# Patient Record
Sex: Female | Born: 1965 | Race: White | Hispanic: No | Marital: Married | Smoking: Never smoker
Health system: Southern US, Community
[De-identification: ages and names within clinical notes are randomized; demographics above are authoritative.]

## PROBLEM LIST (undated history)

## (undated) DIAGNOSIS — G43009 Migraine without aura, not intractable, without status migrainosus: Secondary | ICD-10-CM

## (undated) DIAGNOSIS — I493 Ventricular premature depolarization: Secondary | ICD-10-CM

## (undated) DIAGNOSIS — C801 Malignant (primary) neoplasm, unspecified: Secondary | ICD-10-CM

## (undated) DIAGNOSIS — R42 Dizziness and giddiness: Secondary | ICD-10-CM

## (undated) DIAGNOSIS — E039 Hypothyroidism, unspecified: Secondary | ICD-10-CM

## (undated) HISTORY — DX: Migraine without aura, not intractable, without status migrainosus: G43.009

## (undated) HISTORY — DX: Hypothyroidism, unspecified: E03.9

## (undated) HISTORY — DX: Ventricular premature depolarization: I49.3

## (undated) HISTORY — DX: Dizziness and giddiness: R42

## (undated) HISTORY — DX: Malignant (primary) neoplasm, unspecified: C80.1

## (undated) HISTORY — PX: SALPINGOOPHORECTOMY: SHX82

## (undated) HISTORY — PX: VAGINAL HYSTERECTOMY: SUR661

---

## 2000-07-23 ENCOUNTER — Emergency Department (HOSPITAL_COMMUNITY): Admission: EM | Admit: 2000-07-23 | Discharge: 2000-07-23 | Payer: Self-pay | Admitting: Emergency Medicine

## 2000-07-23 ENCOUNTER — Encounter: Payer: Self-pay | Admitting: Emergency Medicine

## 2005-12-18 ENCOUNTER — Ambulatory Visit (HOSPITAL_COMMUNITY): Admission: RE | Admit: 2005-12-18 | Discharge: 2005-12-18 | Payer: Self-pay | Admitting: Family Medicine

## 2010-03-28 ENCOUNTER — Encounter
Admission: RE | Admit: 2010-03-28 | Discharge: 2010-03-28 | Payer: Self-pay | Source: Home / Self Care | Attending: Podiatrist | Admitting: Podiatrist

## 2010-09-09 DIAGNOSIS — K219 Gastro-esophageal reflux disease without esophagitis: Secondary | ICD-10-CM | POA: Insufficient documentation

## 2010-10-13 DIAGNOSIS — E669 Obesity, unspecified: Secondary | ICD-10-CM | POA: Insufficient documentation

## 2010-10-13 DIAGNOSIS — J309 Allergic rhinitis, unspecified: Secondary | ICD-10-CM | POA: Insufficient documentation

## 2011-05-18 ENCOUNTER — Other Ambulatory Visit (HOSPITAL_COMMUNITY): Payer: Self-pay | Admitting: Family Medicine

## 2011-05-18 DIAGNOSIS — S92209A Fracture of unspecified tarsal bone(s) of unspecified foot, initial encounter for closed fracture: Secondary | ICD-10-CM

## 2011-05-18 DIAGNOSIS — S92309A Fracture of unspecified metatarsal bone(s), unspecified foot, initial encounter for closed fracture: Secondary | ICD-10-CM

## 2011-05-19 ENCOUNTER — Ambulatory Visit (HOSPITAL_COMMUNITY)
Admission: RE | Admit: 2011-05-19 | Discharge: 2011-05-19 | Disposition: A | Discharge status: Home / Self Care | Payer: 59 | Source: Ambulatory Visit | Attending: Family Medicine | Admitting: Family Medicine

## 2011-05-19 DIAGNOSIS — X58XXXA Exposure to other specified factors, initial encounter: Secondary | ICD-10-CM | POA: Insufficient documentation

## 2011-05-19 DIAGNOSIS — S92309A Fracture of unspecified metatarsal bone(s), unspecified foot, initial encounter for closed fracture: Secondary | ICD-10-CM | POA: Insufficient documentation

## 2011-05-19 DIAGNOSIS — S92209A Fracture of unspecified tarsal bone(s) of unspecified foot, initial encounter for closed fracture: Secondary | ICD-10-CM

## 2011-05-19 DIAGNOSIS — Z780 Asymptomatic menopausal state: Secondary | ICD-10-CM | POA: Insufficient documentation

## 2011-06-29 ENCOUNTER — Other Ambulatory Visit: Payer: Self-pay | Admitting: Podiatrist

## 2011-06-29 DIAGNOSIS — M79673 Pain in unspecified foot: Secondary | ICD-10-CM

## 2011-06-30 ENCOUNTER — Ambulatory Visit
Admission: RE | Admit: 2011-06-30 | Discharge: 2011-06-30 | Disposition: A | Discharge status: Home / Self Care | Payer: 59 | Source: Ambulatory Visit | Attending: Podiatrist | Admitting: Podiatrist

## 2011-06-30 DIAGNOSIS — M79673 Pain in unspecified foot: Secondary | ICD-10-CM

## 2011-07-13 ENCOUNTER — Encounter: Payer: Self-pay | Admitting: *Deleted

## 2011-07-15 ENCOUNTER — Encounter: Payer: Self-pay | Admitting: *Deleted

## 2011-07-17 ENCOUNTER — Encounter: Payer: Self-pay | Admitting: Cardiology

## 2011-07-17 ENCOUNTER — Ambulatory Visit (INDEPENDENT_AMBULATORY_CARE_PROVIDER_SITE_OTHER): Payer: 59 | Admitting: Cardiology

## 2011-07-17 DIAGNOSIS — I493 Ventricular premature depolarization: Secondary | ICD-10-CM | POA: Insufficient documentation

## 2011-07-17 DIAGNOSIS — E039 Hypothyroidism, unspecified: Secondary | ICD-10-CM | POA: Insufficient documentation

## 2011-07-17 DIAGNOSIS — I4949 Other premature depolarization: Secondary | ICD-10-CM

## 2011-07-17 DIAGNOSIS — R0989 Other specified symptoms and signs involving the circulatory and respiratory systems: Secondary | ICD-10-CM

## 2011-07-17 NOTE — Assessment & Plan Note (Signed)
On Synthroid, with normal TSH in February.

## 2011-07-17 NOTE — Progress Notes (Signed)
Clinical Summary [name] is a 46 y.o.female referred for cardiology consultation by Terie Purser in [place]. She was previously followed by [facility] with a history of symptomatic PVCs and palpitations. This was originally diagnosed back in 2009 by cardiac monitor, at which point she had an echocardiogram demonstrating no major structural abnormalities with normal LVEF. She is not on any specific medical therapy for this, and states that her symptoms ultimately got better.   Within the last 3 months she has had more palpitations, mainly in February, however now indicates that her symptoms are better. She has not had any syncope or chest pain associated with these symptoms. She feels them during the day, also at nighttime. She reports regular caffeine intake of at least 2 drinks a day, no regular exercise since injuring her left ankle earlier in the year.  I reviewed her outside records from [facility] today. She was actually just recently seen in February, at which time observation was recommended. She is concurrently being treated for hypothyroidism with Synthroid, and had normal TSH of 2.9 from February.   Allergies  Allergen Reactions  . Penicillins Rash    Current Outpatient Prescriptions  Medication Sig Dispense Refill  . Cholecalciferol (VITAMIN D3) 1000 UNITS CAPS Take 1 capsule by mouth daily.      . fluticasone (FLONASE) 50 MCG/ACT nasal spray Place 2 sprays into both nostrils Daily.      Marland Kitchen HYDROcodone-acetaminophen (NORCO) 5-325 MG per tablet Take 1 tablet by mouth as needed.      Marland Kitchen levothyroxine (SYNTHROID, LEVOTHROID) 50 MCG tablet Take 50 mcg by mouth daily.      . Multiple Vitamin (MULTIVITAMIN) tablet Take 1 tablet by mouth daily.      Marland Kitchen VIVELLE-DOT 0.075 MG/24HR Apply 1 patch topically 2 (two) times a week.        Past Medical History  Diagnosis Date  . PVC's (premature ventricular contractions)   . Hypothyroidism     Past Surgical History    Procedure Date  . Vaginal hysterectomy   . Salpingoophorectomy     LEFT    Family History  Problem Relation Age of Onset  . Hypothyroidism Mother   . Hypothyroidism Sister   . Arrhythmia Mother     PVC's    Social History Ms. Sisk reports that she has been smoking Cigarettes.  She has never used smokeless tobacco. [name] reports that she drinks alcohol.  Review of Systems No orthopnea or PND. Stable appetite. No reported bleeding problems. Otherwise negative except as outlined above.  Physical Examination Filed Vitals:   07/17/11 1318  BP: 117/61  Pulse: 69   Overweight woman in no acute distress. HEENT: Conjunctiva and lids normal, oropharynx clear. Neck: Supple, no elevated JVP or carotid bruits, no thyromegaly. Lungs: Clear to auscultation, nonlabored breathing at rest. Cardiac: Regular rate and rhythm, no S3 or significant systolic murmur, no pericardial rub. Abdomen: Soft, nontender, bowel sounds present, no guarding or rebound. Extremities: No pitting edema, distal pulses 2+. Skin: Warm and dry. Musculoskeletal: No kyphosis. Neuropsychiatric: Alert and oriented x3, affect grossly appropriate.   ECG Reviewed in EMR.   Problem List and Plan   PVC's (premature ventricular contractions) At this point would recommend observation based on improvement in her symptoms and previous workup. Records were reviewed today. ECG is nonspecific. She reports no definite exertional chest pain or limiting shortness of breath, no syncope with her palpitations. I did discuss limiting caffeine, preferably discontinue its use if she  is able, also try to initiate a regular exercise regimen again. Followup will be arranged in 6 months. If her symptoms worsen, will probably start with placing a followup cardiac monitor  Hypothyroidism On Synthroid, with normal TSH in February.     Jonelle Sidle, M.D., F.A.C.C.

## 2011-07-17 NOTE — Assessment & Plan Note (Signed)
At this point would recommend observation based on improvement in her symptoms and previous workup. Records were reviewed today. ECG is nonspecific. She reports no definite exertional chest pain or limiting shortness of breath, no syncope with her palpitations. I did discuss limiting caffeine, preferably discontinue its use if she is able, also try to initiate a regular exercise regimen again. Followup will be arranged in 6 months. If her symptoms worsen, will probably start with placing a followup cardiac monitor

## 2011-07-17 NOTE — Patient Instructions (Signed)
Your physician wants you to follow-up in: 6 months. You will receive a reminder letter in the mail one-two months in advance. If you don't receive a letter, please call our office to schedule the follow-up appointment. Your physician recommends that you continue on your current medications as directed. Please refer to the Current Medication list given to you today. 

## 2013-04-14 ENCOUNTER — Ambulatory Visit (INDEPENDENT_AMBULATORY_CARE_PROVIDER_SITE_OTHER): Payer: 59 | Admitting: Podiatrist

## 2013-04-14 ENCOUNTER — Ambulatory Visit (INDEPENDENT_AMBULATORY_CARE_PROVIDER_SITE_OTHER): Payer: 59

## 2013-04-14 ENCOUNTER — Encounter: Payer: Self-pay | Admitting: Podiatrist

## 2013-04-14 DIAGNOSIS — M79609 Pain in unspecified limb: Secondary | ICD-10-CM

## 2013-04-14 DIAGNOSIS — M7730 Calcaneal spur, unspecified foot: Secondary | ICD-10-CM

## 2013-04-14 DIAGNOSIS — M722 Plantar fascial fibromatosis: Secondary | ICD-10-CM

## 2013-04-14 MED ORDER — PREDNISONE 10 MG PO KIT: PACK | ORAL | Status: DC

## 2013-04-14 MED ORDER — MELOXICAM 15 MG PO TABS: 15.0000 mg | ORAL_TABLET | Freq: Every day | ORAL | Status: DC

## 2013-04-14 NOTE — Patient Instructions (Addendum)
If the heel pain does not improve within 2 days of the injection- go ahead and take the prednisone taper pack-- then start the meloxicam after that-- do not take the 2 medications together.    Plantar Fasciitis (Heel Spur Syndrome) with Rehab The plantar fascia is a fibrous, ligament-like, soft-tissue structure that spans the bottom of the foot. Plantar fasciitis is a condition that causes pain in the foot due to inflammation of the tissue. SYMPTOMS   Pain and tenderness on the underneath side of the foot.  Pain that worsens with standing or walking. CAUSES  Plantar fasciitis is caused by irritation and injury to the plantar fascia on the underneath side of the foot. Common mechanisms of injury include:  Direct trauma to bottom of the foot.  Damage to a small nerve that runs under the foot where the main fascia attaches to the heel bone. Stress placed on the plantar fascia due to any mild increased activity or injury RISK INCREASES WITH:   Obesity.  Poor strength and flexibility.  Improperly fitted shoes.  Tight calf muscles.  Flat feet.  Failure to warm-up properly before activity.  PREVENTION  Warm up and stretch properly before activity.  Strength, flexibility  Maintain a health body weight.  Avoid stress on the plantar fascia.  Wear properly fitted shoes, including arch supports for individuals who have flat feet. PROGNOSIS  If treated properly, then the symptoms of plantar fasciitis usually resolve without surgery. However, occasionally surgery is necessary. RELATED COMPLICATIONS   Recurrent symptoms that may result in a chronic condition.  Problems of the lower back that are caused by compensating for the injury, such as limping.  Pain or weakness of the foot during push-off following surgery.  Chronic inflammation, scarring, and partial or complete fascia tear, occurring more often from repeated injections. TREATMENT  Treatment initially involves the use  of ice and medication to help reduce pain and inflammation. The use of strengthening and stretching exercises may help reduce pain with activity, especially stretches of the Achilles tendon.  Your caregiver may recommend that you use arch supports to help reduce stress on the plantar fascia. Often, corticosteroid injections are given to reduce inflammation. If symptoms persist for greater than 6 months despite non-surgical (conservative), then surgery may be recommended.  MEDICATION   If pain medication is necessary, then nonsteroidal anti-inflammatory medications, such as aspirin and ibuprofen, or other minor pain relievers, such as acetaminophen, are often recommended. Corticosteroid injections may be given by your caregiver.  HEAT AND COLD  Cold treatment (icing) relieves pain and reduces inflammation. Cold treatment should be applied for 10 to 15 minutes every 2 to 3 hours for inflammation and pain and immediately after any activity that aggravates your symptoms. Use ice packs or massage the area with a piece of ice (ice massage).  Heat treatment may be used prior to performing the stretching and strengthening activities prescribed by your caregiver, physical therapist, or athletic trainer. Use a heat pack or soak the injury in warm water. SEEK IMMEDIATE MEDICAL CARE IF:  Treatment seems to offer no benefit, or the condition worsens.  Any medications produce adverse side effects.    EXERCISES-- perform each exercise a total of 10-15 repetitions.  Hold for 30 seconds and perform 3 times per day   RANGE OF MOTION (ROM) AND STRETCHING EXERCISES - Plantar Fasciitis (Heel Spur Syndrome) These exercises may help you when beginning to rehabilitate your injury.   While completing these exercises, remember:   Restoring tissue  flexibility helps normal motion to return to the joints. This allows healthier, less painful movement and activity.  An effective stretch should be held for at least 30  seconds.  A stretch should never be painful. You should only feel a gentle lengthening or release in the stretched tissue. RANGE OF MOTION - Toe Extension, Flexion  Sit with your right / left leg crossed over your opposite knee.  Grasp your toes and gently pull them back toward the top of your foot. You should feel a stretch on the bottom of your toes and/or foot.  Hold this stretch for __________ seconds.  Now, gently pull your toes toward the bottom of your foot. You should feel a stretch on the top of your toes and or foot.  Hold this stretch for __________ seconds. Repeat __________ times. Complete this stretch __________ times per day.  RANGE OF MOTION - Ankle Dorsiflexion, Active Assisted  Remove shoes and sit on a chair that is preferably not on a carpeted surface.  Place right / left foot under knee. Extend your opposite leg for support.  Keeping your heel down, slide your right / left foot back toward the chair until you feel a stretch at your ankle or calf. If you do not feel a stretch, slide your bottom forward to the edge of the chair, while still keeping your heel down.  Hold this stretch for __________ seconds. Repeat __________ times. Complete this stretch __________ times per day.  STRETCH  Gastroc, Standing  Place hands on wall.  Extend right / left leg, keeping the front knee somewhat bent.  Slightly point your toes inward on your back foot.  Keeping your right / left heel on the floor and your knee straight, shift your weight toward the wall, not allowing your back to arch.  You should feel a gentle stretch in the right / left calf. Hold this position for __________ seconds. Repeat __________ times. Complete this stretch __________ times per day. STRETCH  Soleus, Standing  Place hands on wall.  Extend right / left leg, keeping the other knee somewhat bent.  Slightly point your toes inward on your back foot.  Keep your right / left heel on the floor,  bend your back knee, and slightly shift your weight over the back leg so that you feel a gentle stretch deep in your back calf.  Hold this position for __________ seconds. Repeat __________ times. Complete this stretch __________ times per day. STRETCH  Gastrocsoleus, Standing  Note: This exercise can place a lot of stress on your foot and ankle. Please complete this exercise only if specifically instructed by your caregiver.   Place the ball of your right / left foot on a step, keeping your other foot firmly on the same step.  Hold on to the wall or a rail for balance.  Slowly lift your other foot, allowing your body weight to press your heel down over the edge of the step.  You should feel a stretch in your right / left calf.  Hold this position for __________ seconds.  Repeat this exercise with a slight bend in your right / left knee. Repeat __________ times. Complete this stretch __________ times per day.  STRENGTHENING EXERCISES - Plantar Fasciitis (Heel Spur Syndrome)  These exercises may help you when beginning to rehabilitate your injury. They may resolve your symptoms with or without further involvement from your physician, physical therapist or athletic trainer. While completing these exercises, remember:   Muscles can gain  both the endurance and the strength needed for everyday activities through controlled exercises.  Complete these exercises as instructed by your physician, physical therapist or athletic trainer. Progress the resistance and repetitions only as guided.

## 2013-04-14 NOTE — Progress Notes (Signed)
   Subjective:    Patient ID: Kristine Melton, female    DOB: 03/18/1965, 48 y.o.   MRN: [id number]  HPI patient presents today for right heel pain. She states "my right heel is hurting and has been going on for about 3 weeks and pain in am and have been sleeping in the night splint and hurts to walk". She's been treated for plantar fasciitis in the past and she feels it may have flared up again.    Review of Systems  Hematological: Bruises/bleeds easily.  All other systems reviewed and are negative.       Objective:   Physical Exam  GENERAL APPEARANCE: Alert, conversant. Appropriately groomed. No acute distress.  VASCULAR: Pedal pulses palpable at 2/4 DP and PT bilateral.  Capillary refill time is immediate to all digits,  Proximal to distal cooling it warm to warm.  Digital hair growth is present bilateral  NEUROLOGIC: sensation is intact epicritically and protectively to 5.07 monofilament at 5/5 sites bilateral.  Light touch is intact bilateral, vibratory sensation intact bilateral, achilles tendon reflex is intact bilateral.  MUSCULOSKELETAL: Pain on palpation plantar medial aspect of the right heel at the insertion of the plantar fascia on the medial calcaneal tubercle is noted. Swelling in this area is also palpated. DERMATOLOGIC: skin color, texture, and turger are within normal limits.  No preulcerative lesions are seen, no interdigital maceration noted.  No open lesions present.  Digital nails are asymptomatic.      Assessment & Plan:  Plantar fasciitis right  Plan:  Discussed etiology, pathology, conservative vs. Surgical therapies and at this time a plantar fascial injection was recommended.  The patient agreed and a sterile skin prep was applied.  An injection consisting of kenalog and marcaine mixture was infiltrated at the point of maximal tenderness on the right Heel.  The patient tolerated this well and was given instructions for aftercare. rx for a predinisone taper and  meloxicam was e prescribed.  I will see her back for recheck in 3-4 weeks.

## 2013-05-29 ENCOUNTER — Telehealth: Payer: Self-pay | Admitting: *Deleted

## 2013-05-29 NOTE — Telephone Encounter (Signed)
I need a refill for my Voltaren Gel.  Call into [facility] [place], [place].

## 2013-05-30 MED ORDER — DICLOFENAC SODIUM 1 % TD GEL: 2.0000 g | Freq: Four times a day (QID) | TRANSDERMAL | Status: DC

## 2013-05-30 NOTE — Telephone Encounter (Signed)
I called and left the patient a message that her prescription was sent to the pharmacy.

## 2013-05-30 NOTE — Telephone Encounter (Signed)
rx for voltaren gel called into [facility] [place]- 4 refills

## 2014-05-08 ENCOUNTER — Encounter: Payer: Self-pay | Admitting: Neurology

## 2014-05-09 ENCOUNTER — Encounter: Payer: Self-pay | Admitting: Neurology

## 2014-05-09 ENCOUNTER — Ambulatory Visit (INDEPENDENT_AMBULATORY_CARE_PROVIDER_SITE_OTHER): Payer: 59 | Admitting: Neurology

## 2014-05-09 VITALS — BP 131/86 | HR 66 | Temp 97.8°F | Resp 16 | Ht 67.0 in | Wt 233.0 lb

## 2014-05-09 DIAGNOSIS — H8120 Vestibular neuronitis, unspecified ear: Secondary | ICD-10-CM | POA: Diagnosis not present

## 2014-05-09 DIAGNOSIS — R42 Dizziness and giddiness: Secondary | ICD-10-CM

## 2014-05-09 DIAGNOSIS — H8190 Unspecified disorder of vestibular function, unspecified ear: Secondary | ICD-10-CM

## 2014-05-09 DIAGNOSIS — H9313 Tinnitus, bilateral: Secondary | ICD-10-CM

## 2014-05-09 NOTE — Progress Notes (Signed)
Subjective:    Patient ID: Kristine Melton is a 49 y.o. female.  HPI     Star Age, MD, PhD [facility] [address] [place], [place] [address]  Dear Dr. Redmond Pulling,  I saw your patient, Kristine Melton, upon your kind request in my neurologic clinic today for initial consultation of her intermittent vertiginous symptoms and dizziness. The patient is unaccompanied today. As you know, [name] is a 49 year old right-handed woman with an underlying medical history of obesity, hypothyroidism, anxiety, and allergies, who reports recurrent episodes of vertigo type symptoms which can last up to hours. She had a mildly abnormal electro nystagmogram but she was not felt to have peripheral vestibular disorder. She was given a prescription for Imitrex. Symptoms started approximately 3 months ago. Triggers may include change in position. Her electronystagmogram report from 04/12/2014 was reviewed and showed right beating nystagmus with eyes closed and the gaze rightward position. Dix-Hallpike testing was negative for BPPV. Positional testing revealed a direction fixed left beating positional nystagmus with eyes closed in 4 positions suggesting a peripheral pathology. Caloric testing revealed directional preponderance of 21% on the right which suggest mild peripheral pathology. She has no visual aura, no nausea, vomiting, except one time in December 2015. She reports sinus issues, congestion and difficulty breathing through her nose at night. She has no personal history of migrainous HAs, but mother and sister have migraines, MGM has migraines, and mother also vertigo.  She reports no throbbing headaches, no hearing loss, no one-sided weakness, numbness, tingling, droopy face or slurring of speech, and is currently experiencing mild lightheadedness at times especially with change in position. She has not tried Imitrex. Since her big bout of vertigo in December she has  had smaller episodes. Symptoms may last for minutes only. She has ringing in her ears bilaterally which can last for days.  Her Past Medical History Is Significant For: Past Medical History  Diagnosis Date  . PVC's (premature ventricular contractions)   . Hypothyroidism   . Atypical migraine   . Vertigo   . Cancer     Skin    Her Past Surgical History Is Significant For: Past Surgical History  Procedure Laterality Date  . Vaginal hysterectomy    . Salpingoophorectomy      LEFT    Her Family History Is Significant For: Family History  Problem Relation Age of Onset  . Hypothyroidism Mother   . Arrhythmia Mother     PVC's  . Fibromyalgia Mother   . Macular degeneration Mother   . Migraines Mother   . Hypothyroidism Sister     Her Social History Is Significant For: History   Social History  . Marital Status: Married    Spouse Name: Charles Niese  . Number of Children: 2  . Years of Education: Associates   Occupational History  . 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 Smoking status: Never Smoker   . Smokeless tobacco: Never Used  . Alcohol Use: 0.0 oz/week    0 Standard drinks or equivalent per week     Comment: Occasional  . Drug Use: No  . Sexual Activity: Not on file   Other Topics Concern  . None   Social History Narrative   Has 1 or 2 drinks with caffeine a day .     Her Allergies Are:  Allergies  Allergen Reactions  . Amoxicillin   .  Penicillins Rash  :   Her Current Medications Are:  Outpatient Encounter Prescriptions as of 05/09/2014  Medication Sig  . ALPRAZolam (XANAX) 0.5 MG tablet Take 0.5 mg by mouth 2 (two) times daily.  . Cholecalciferol (VITAMIN D-3 PO) Take by mouth.  . diclofenac sodium (VOLTAREN) 1 % GEL Apply topically 4 (four) times daily.  Marland Kitchen estradiol (VIVELLE-DOT) 0.075 MG/24HR Place 1 patch onto the skin.  Marland Kitchen estradiol (VIVELLE-DOT) 0.1 MG/24HR patch Place 1 patch onto the  skin 2 (two) times a week.  . fluticasone (FLONASE) 50 MCG/ACT nasal spray Place 2 sprays into both nostrils Daily.  Marland Kitchen HYDROcodone-acetaminophen (NORCO/VICODIN) 5-325 MG per tablet Take 1 tablet by mouth every 6 (six) hours as needed for moderate pain.  Marland Kitchen levothyroxine (SYNTHROID, LEVOTHROID) 50 MCG tablet Take 50 mcg by mouth daily.  Marland Kitchen levothyroxine (SYNTHROID, LEVOTHROID) 50 MCG tablet TAKE 1 TABLET (50 MCG TOTAL) BY MOUTH DAILY.  . meloxicam (MOBIC) 15 MG tablet Take 15 mg by mouth daily.  . [DISCONTINUED] ALPRAZolam (XANAX) 0.5 MG tablet Take 0.5 mg by mouth 3 (three) times daily as needed for anxiety.  :  Review of Systems:  Out of a complete 14 point review of systems, all are reviewed and negative with the exception of these symptoms as listed below:    Review of Systems  HENT: Positive for tinnitus.        Spinning sensation    Objective:  Neurologic Exam  Physical Exam Physical Examination:   Filed Vitals:   05/09/14 0902  BP: 131/86  Pulse: 66  Temp:   Resp:     General Examination: The patient is a very pleasant 49 y.o. female in no acute distress. She appears well-developed and well-nourished and well groomed.   HEENT: Normocephalic, atraumatic, pupils are equal, round and reactive to light and accommodation. Funduscopic exam is normal with sharp disc margins noted. Extraocular tracking is good without limitation to gaze excursion or nystagmus noted. Normal smooth pursuit is noted. Hearing is grossly intact. Tympanic membranes are clear bilaterally. Face is symmetric with normal facial animation and normal facial sensation. Speech is clear with no dysarthria noted. There is no hypophonia. There is no lip, neck/head, jaw or voice tremor. Neck is supple with full range of passive and active motion. There are no carotid bruits on auscultation. Oropharynx exam reveals: mild mouth dryness, good dental hygiene and mild airway crowding, due to narrow airway entry. Mallampati is  class II. Tongue protrudes centrally and palate elevates symmetrically. Tonsils are absent.  She has no vertiginous symptoms upon change in head position.  Chest: Clear to auscultation without wheezing, rhonchi or crackles noted.  Heart: S1+S2+0, regular and normal without murmurs, rubs or gallops noted.   Abdomen: Soft, non-tender and non-distended with normal bowel sounds appreciated on auscultation.  Extremities: There is no pitting edema in the distal lower extremities bilaterally. Pedal pulses are intact.  Skin: Warm and dry without trophic changes noted. There are no varicose veins.  Musculoskeletal: exam reveals no obvious joint deformities, tenderness or joint swelling or erythema.   Neurologically:  Mental status: The patient is awake, alert and oriented in all 4 spheres. Her immediate and remote memory, attention, language skills and fund of knowledge are appropriate. There is no evidence of aphasia, agnosia, apraxia or anomia. Speech is clear with normal prosody and enunciation. Thought process is linear. Mood is normal and affect is normal.  Cranial nerves II - XII are as described above under HEENT exam. In addition:  shoulder shrug is normal with equal shoulder height noted. Motor exam: Normal bulk, strength and tone is noted. There is no drift, tremor or rebound. Romberg is negative except for minimal sway. Reflexes are 2+ throughout. Babinski: Toes are flexor bilaterally. Fine motor skills and coordination: intact with normal finger taps, normal hand movements, normal rapid alternating patting, normal foot taps and normal foot agility.  Cerebellar testing: No dysmetria or intention tremor on finger to nose testing. Heel to shin is unremarkable bilaterally. There is no truncal or gait ataxia.  Sensory exam: intact to light touch, pinprick, vibration, temperature sense in the upper and lower extremities.  Gait, station and balance: She stands easily. No veering to one side is noted.  No leaning to one side is noted. Posture is age-appropriate and stance is narrow based. Gait shows normal stride length and normal pace. No problems turning are noted. She turns en bloc. Tandem walk is slightly difficult for her. Intact toe and heel stance is noted.               Assessment and Plan:   In summary, ZOSIA LUCCHESE is a very pleasant 49 y.o.-year old female with an underlying medical history of obesity, hypothyroidism, anxiety, and allergies, who reports recurrent episodes of vertigo type symptoms which can last up to hours. Her physical exam is benign neurological exam is nonfocal. Her balance is slightly off.  with a  her history is not telltale for vertiginous migraines. In fact she has no clear migraine type features. She does have a family history of migraines. She has sinus headaches. She takes ibuprofen as needed for this. She also uses a saline nasal rinse system. At this juncture, I suggested we proceed with a brain MRI with and without contrast and if this is normal she can follow-up as needed with me. She is advised to discuss with you the possibility of doing vestibular rehabilitation. She describes bilateral tinnitus and her ENG was mildly abnormal. I reassured her that her neurological exam is benign at this time. I did not suggest any new medications. I explained to her that vertigo can recur without warning and can last for hours or sometimes days. She is advised to keep well hydrated and well rested. She is also advised to change positions slowly. We will call her with her MRI results. Her eyes have been checked within the last year and she just had blood work for thyroid function, which per her report were normal.   Thank you very much for allowing me to participate in the care of this nice patient. If I can be of any further assistance to you please do not hesitate to call me at [phone].  Sincerely,   Star Age, MD, PhD

## 2014-05-09 NOTE — Patient Instructions (Addendum)
Please remember, that vertigo can recur without warning. It can last hours or days, rarely longer than 2 weeks. Please change positions slowly and always stay well-hydrated. Physical therapy with particular attention to vestibular rehabilitation can be very helpful, please talk to Dr. Redmond Pulling about this. While there is no specific medication that helps with vertigo, some medications can exacerbate vertigo, especially sedating medications.   I would suggest a brain MRI for completion and we will call you with the results. I will see you back as needed, if the MRI is unremarkable.

## 2014-05-12 ENCOUNTER — Encounter: Payer: Self-pay | Admitting: Neurology

## 2014-05-17 ENCOUNTER — Ambulatory Visit: Payer: 59

## 2014-05-18 DIAGNOSIS — H9313 Tinnitus, bilateral: Secondary | ICD-10-CM | POA: Diagnosis not present

## 2014-05-18 DIAGNOSIS — H8120 Vestibular neuronitis, unspecified ear: Secondary | ICD-10-CM | POA: Diagnosis not present

## 2014-05-22 ENCOUNTER — Telehealth: Payer: Self-pay | Admitting: Neurology

## 2014-05-22 NOTE — Telephone Encounter (Signed)
Patient calling for MRI results.  Please call and advise.

## 2014-05-23 NOTE — Telephone Encounter (Signed)
Talked to patient. She is aware of results and is wondering when to come back. I told her that I would find out from Dr. Rexene Alberts and call her back.

## 2014-05-23 NOTE — Telephone Encounter (Signed)
What would you like me to tell her?

## 2014-05-23 NOTE — Telephone Encounter (Signed)
Age-appropriate changes were seen on the MRI, no stroke, no abnormal contrast uptake, no problem with her inner ear or inner ear nerves.

## 2014-05-24 NOTE — Telephone Encounter (Signed)
As discussed, FU with ENT is recommended and she was advised to discuss vestibular rehab with ENT doctor as well. Pt was given recs in writing in AVS and I sent my office note to Dr. Redmond Pulling as well.

## 2014-05-24 NOTE — Telephone Encounter (Signed)
Talked to Anala and she is aware. She will f/u with Dr. Redmond Pulling.

## 2014-05-24 NOTE — Telephone Encounter (Signed)
Kristine Melton wants to know when you would like to see her back? Also there was mention of vestibular rehab?

## 2015-12-04 ENCOUNTER — Encounter: Payer: Self-pay | Admitting: Podiatry

## 2015-12-04 ENCOUNTER — Ambulatory Visit (INDEPENDENT_AMBULATORY_CARE_PROVIDER_SITE_OTHER): Payer: No Typology Code available for payment source

## 2015-12-04 ENCOUNTER — Ambulatory Visit (INDEPENDENT_AMBULATORY_CARE_PROVIDER_SITE_OTHER): Payer: No Typology Code available for payment source | Admitting: Podiatry

## 2015-12-04 DIAGNOSIS — M79671 Pain in right foot: Secondary | ICD-10-CM

## 2015-12-04 DIAGNOSIS — M216X9 Other acquired deformities of unspecified foot: Secondary | ICD-10-CM | POA: Diagnosis not present

## 2015-12-04 DIAGNOSIS — M722 Plantar fascial fibromatosis: Secondary | ICD-10-CM

## 2015-12-04 DIAGNOSIS — L84 Corns and callosities: Secondary | ICD-10-CM

## 2015-12-04 MED ORDER — TRIAMCINOLONE ACETONIDE 10 MG/ML IJ SUSP
10.0000 mg | Freq: Once | INTRAMUSCULAR | Status: AC
  Administered 2015-12-04: 10 mg

## 2015-12-04 NOTE — Patient Instructions (Signed)

## 2015-12-05 NOTE — Progress Notes (Signed)
Subjective:     Patient ID: Kristine Melton, female   DOB: 1966/01/21, 50 y.o.   MRN: [id number]  HPI patient presents with exquisite discomfort in the plantar aspect of the heel right with patient also noted to have callus formation with pain fifth metatarsal head   Review of Systems     Objective:   Physical Exam Neurovascular status intact muscle strength adequate with patient noted to have structural changes fifth metatarsal and exquisite discomfort in the heel right with moderate depression of the arch    Assessment:     2 separate problems with one being inflammatory fasciitis and the other being plantarflexed metatarsal with lesion formation    Plan:     H&P both conditions reviewed and at this time I injected the plantar fascia 3 mg Kenalog 5 mg Xylocaine and dispensed a fascial brace with instructions on usage. I then went ahead debris did lesion and discussed possible osteotomy in future depending how it responds and what it does with this  X-rays indicate spur formation with no indications of stress fracture or advanced arthritis

## 2015-12-18 ENCOUNTER — Ambulatory Visit: Payer: No Typology Code available for payment source | Admitting: Podiatry

## 2016-09-30 ENCOUNTER — Ambulatory Visit: Payer: No Typology Code available for payment source | Admitting: Podiatry

## 2016-10-01 ENCOUNTER — Encounter: Payer: Self-pay | Admitting: Internal Medicine

## 2016-10-07 ENCOUNTER — Encounter: Payer: Self-pay | Admitting: Podiatry

## 2016-10-07 ENCOUNTER — Ambulatory Visit: Payer: 59

## 2016-10-07 ENCOUNTER — Ambulatory Visit (INDEPENDENT_AMBULATORY_CARE_PROVIDER_SITE_OTHER): Payer: 59

## 2016-10-07 ENCOUNTER — Ambulatory Visit (INDEPENDENT_AMBULATORY_CARE_PROVIDER_SITE_OTHER): Payer: 59 | Admitting: Podiatry

## 2016-10-07 VITALS — BP 104/70 | HR 76 | Resp 16

## 2016-10-07 DIAGNOSIS — M779 Enthesopathy, unspecified: Secondary | ICD-10-CM

## 2016-10-07 DIAGNOSIS — M7662 Achilles tendinitis, left leg: Secondary | ICD-10-CM | POA: Diagnosis not present

## 2016-10-07 DIAGNOSIS — M25572 Pain in left ankle and joints of left foot: Secondary | ICD-10-CM

## 2016-10-07 MED ORDER — DICLOFENAC SODIUM 75 MG PO TBEC: 75.0000 mg | DELAYED_RELEASE_TABLET | Freq: Two times a day (BID) | ORAL | 2 refills | Status: DC

## 2016-10-07 NOTE — Patient Instructions (Signed)

## 2016-10-07 NOTE — Progress Notes (Signed)
Subjective:    Patient ID: Kristine Melton, female   DOB: 51 y.o.   MRN: [id number]   HPI patient states she's developed a not in the back of the left heel over the last month and worse over the last couple weeks. States it's been sore and somewhat hard to walk with    ROS      Objective:  Physical Exam neurovascular status intact negative Homan sign was noted with patient's posterior heel at the muscular tendinous junction found to be inflamed and painful when pressed. Patient states that it is localized to this area and I checked muscle strength Achilles and found to be normal at the current time     Assessment:    Acute Achilles tendinitis left at the musculotendinous junction with the possibility that there may be some pre-tear condition occurring secondary to the enlargement     Plan:   H&P and x-ray reviewed. I explained the possibility for pre-tear of this area and that she's continue to be careful and I did apply an air fracture walker to immobilize allowed to rest and hopefully protected from injury. She cannot wear it at work and we will just have to be careful and hope healing occurs and we will reevaluate again in 4 weeks and it still symptomatic were given need to consider MRI. Also we'll utilize heat ice therapy and we'll start compounding creams which was order to try to reduce the inflammation  X-ray was negative for signs of significant bone spurs or other calcification process

## 2016-11-02 ENCOUNTER — Encounter: Payer: Self-pay | Admitting: Podiatry

## 2016-11-02 ENCOUNTER — Ambulatory Visit (INDEPENDENT_AMBULATORY_CARE_PROVIDER_SITE_OTHER): Payer: 59 | Admitting: Podiatry

## 2016-11-02 ENCOUNTER — Telehealth: Payer: Self-pay | Admitting: *Deleted

## 2016-11-02 DIAGNOSIS — M25522 Pain in left elbow: Secondary | ICD-10-CM | POA: Diagnosis not present

## 2016-11-02 DIAGNOSIS — S86012S Strain of left Achilles tendon, sequela: Secondary | ICD-10-CM | POA: Diagnosis not present

## 2016-11-02 NOTE — Telephone Encounter (Signed)
"  Kristine Melton was scheduled for a MRI left ankle without contrast for tomorrow at 5:15 pm.  It needs authorization from [place]."  I will try to get it authorized.

## 2016-11-02 NOTE — Progress Notes (Signed)
mri

## 2016-11-02 NOTE — Progress Notes (Signed)
Subjective:    Patient ID: Kristine Melton, female   DOB: 51 y.o.   MRN: [id number]   HPI patient states that she still having a lot of pain in the left Achilles tendon and that the boot seems to make a little bit of difference but the foot is still very sore and she cannot lay it down    ROS      Objective:  Physical Exam neurovascular status intact with patient continuing to have some form of deficit at the musculotendinous junction of the left Achilles tendon with pain with palpation     Assessment:    Very worrisome that this may be a partial tear of the Achilles tendon left or other unknown pathology     Plan:     Reviewed condition and at this point due to the fact it's not responded well to immobilization I've recommended MRI to see whether or not there may be a tear of the fascia or the Achilles itself

## 2016-11-03 ENCOUNTER — Ambulatory Visit
Admission: RE | Admit: 2016-11-03 | Discharge: 2016-11-03 | Disposition: A | Discharge status: Home / Self Care | Payer: 59 | Source: Ambulatory Visit | Attending: Podiatry | Admitting: Podiatry

## 2016-11-03 ENCOUNTER — Other Ambulatory Visit: Payer: Self-pay | Admitting: Podiatry

## 2016-11-03 DIAGNOSIS — S86012A Strain of left Achilles tendon, initial encounter: Secondary | ICD-10-CM

## 2016-11-03 NOTE — Telephone Encounter (Signed)
I called and informed Dorian Pod that the MRI was authorized.  The authorization number is [id number].  It's authorized from 11/03/2016 to 02/01/2017.

## 2016-11-11 ENCOUNTER — Ambulatory Visit (INDEPENDENT_AMBULATORY_CARE_PROVIDER_SITE_OTHER): Payer: 59 | Admitting: Podiatry

## 2016-11-11 ENCOUNTER — Encounter: Payer: Self-pay | Admitting: Podiatry

## 2016-11-11 DIAGNOSIS — S86012S Strain of left Achilles tendon, sequela: Secondary | ICD-10-CM | POA: Diagnosis not present

## 2016-11-11 DIAGNOSIS — M7662 Achilles tendinitis, left leg: Secondary | ICD-10-CM

## 2016-11-12 NOTE — Progress Notes (Signed)
Subjective:    Patient ID: Kristine Melton, female   DOB: 51 y.o.   MRN: [id number]   HPI patient states she still having a lot of pain in the left Achilles tendon and states that the boot does help but she's not been able to go much without the boot    ROS      Objective:  Physical Exam neurovascular status intact with patient noted to have continued nodular formation at the muscle tendon junction of the left Achilles not at the insertion but at the junction of the muscle tendon     Assessment:    Thickening of the muscle tendon junction of the left Achilles localized in nature with MRI report     Plan:    H&P reviewed MRI and discussed that it does not appear to have full tear and it would be best to try to treat conservatively. I went ahead today and we will start shockwave therapy and I gave her instructions for shockwave to try to increase blood flow and the chance that ultimate surgery will be necessary for this condition. Patient be seen back for [place] to recheck and is scheduled for shockwave and the muscle tendon junction left

## 2016-11-23 ENCOUNTER — Ambulatory Visit: Payer: 59 | Admitting: Internal Medicine

## 2016-11-24 ENCOUNTER — Ambulatory Visit: Payer: 59

## 2017-01-19 ENCOUNTER — Ambulatory Visit: Payer: 59 | Admitting: Internal Medicine

## 2017-01-19 ENCOUNTER — Telehealth: Payer: Self-pay

## 2017-01-19 NOTE — Telephone Encounter (Signed)
No charge per Dr. Gessner. 

## 2017-08-13 ENCOUNTER — Emergency Department (HOSPITAL_COMMUNITY): Payer: 59 | Admitting: Anesthesiology

## 2017-08-13 ENCOUNTER — Other Ambulatory Visit: Payer: Self-pay

## 2017-08-13 ENCOUNTER — Encounter (HOSPITAL_COMMUNITY): Payer: Self-pay | Admitting: Cardiology

## 2017-08-13 ENCOUNTER — Observation Stay (HOSPITAL_COMMUNITY)
Admission: EM | Admit: 2017-08-13 | Discharge: 2017-08-14 | Disposition: A | Discharge status: Home / Self Care | Payer: 59 | Attending: General Surgery | Admitting: General Surgery

## 2017-08-13 ENCOUNTER — Emergency Department (HOSPITAL_COMMUNITY): Payer: 59

## 2017-08-13 ENCOUNTER — Encounter (HOSPITAL_COMMUNITY)
Admission: EM | Disposition: A | Discharge status: Home / Self Care | Payer: Self-pay | Source: Home / Self Care | Attending: Emergency Medicine

## 2017-08-13 DIAGNOSIS — R1011 Right upper quadrant pain: Secondary | ICD-10-CM | POA: Diagnosis present

## 2017-08-13 DIAGNOSIS — K8010 Calculus of gallbladder with chronic cholecystitis without obstruction: Principal | ICD-10-CM | POA: Insufficient documentation

## 2017-08-13 DIAGNOSIS — K810 Acute cholecystitis: Secondary | ICD-10-CM

## 2017-08-13 DIAGNOSIS — Z01818 Encounter for other preprocedural examination: Secondary | ICD-10-CM

## 2017-08-13 DIAGNOSIS — Z882 Allergy status to sulfonamides status: Secondary | ICD-10-CM | POA: Diagnosis not present

## 2017-08-13 DIAGNOSIS — Z880 Allergy status to penicillin: Secondary | ICD-10-CM | POA: Insufficient documentation

## 2017-08-13 DIAGNOSIS — E039 Hypothyroidism, unspecified: Secondary | ICD-10-CM | POA: Insufficient documentation

## 2017-08-13 DIAGNOSIS — Z79899 Other long term (current) drug therapy: Secondary | ICD-10-CM | POA: Insufficient documentation

## 2017-08-13 DIAGNOSIS — Z85820 Personal history of malignant melanoma of skin: Secondary | ICD-10-CM | POA: Insufficient documentation

## 2017-08-13 DIAGNOSIS — I493 Ventricular premature depolarization: Secondary | ICD-10-CM | POA: Insufficient documentation

## 2017-08-13 HISTORY — PX: CHOLECYSTECTOMY: SHX55

## 2017-08-13 LAB — COMPREHENSIVE METABOLIC PANEL
ALBUMIN: 4.4 g/dL (ref 3.5–5.0)
ALT: 24 U/L (ref 14–54)
ANION GAP: 8 (ref 5–15)
AST: 25 U/L (ref 15–41)
Alkaline Phosphatase: 60 U/L (ref 38–126)
BUN: 15 mg/dL (ref 6–20)
CHLORIDE: 103 mmol/L (ref 101–111)
CO2: 27 mmol/L (ref 22–32)
Calcium: 9.2 mg/dL (ref 8.9–10.3)
Creatinine, Ser: 0.81 mg/dL (ref 0.44–1.00)
GFR calc Af Amer: >60 mL/min (ref >60)
Glucose, Bld: 89 mg/dL (ref 65–99)
POTASSIUM: 4.1 mmol/L (ref 3.5–5.1)
Sodium: 138 mmol/L (ref 135–145)
TOTAL PROTEIN: 7.4 g/dL (ref 6.5–8.1)
Total Bilirubin: 1 mg/dL (ref 0.3–1.2)

## 2017-08-13 LAB — CBC WITH DIFFERENTIAL/PLATELET
BASOS ABS: 0 10*3/uL (ref 0.0–0.1)
Basophils Relative: 1 %
EOS PCT: 3 %
Eosinophils Absolute: 0.2 10*3/uL (ref 0.0–0.7)
HEMATOCRIT: 44.3 % (ref 36.0–46.0)
HEMOGLOBIN: 14.6 g/dL (ref 12.0–15.0)
LYMPHS ABS: 1.5 10*3/uL (ref 0.7–4.0)
LYMPHS PCT: 24 %
MCH: 31.6 pg (ref 26.0–34.0)
MCHC: 33 g/dL (ref 30.0–36.0)
MCV: 95.9 fL (ref 78.0–100.0)
Monocytes Absolute: 0.6 10*3/uL (ref 0.1–1.0)
Monocytes Relative: 10 %
NEUTROS ABS: 3.9 10*3/uL (ref 1.7–7.7)
Neutrophils Relative %: 62 %
PLATELETS: 221 10*3/uL (ref 150–400)
RBC: 4.62 MIL/uL (ref 3.87–5.11)
RDW: 12.1 % (ref 11.5–15.5)
WBC: 6.2 10*3/uL (ref 4.0–10.5)

## 2017-08-13 LAB — LIPASE, BLOOD: LIPASE: 34 U/L (ref 11–51)

## 2017-08-13 SURGERY — LAPAROSCOPIC CHOLECYSTECTOMY
Anesthesia: General

## 2017-08-13 MED ORDER — PROPOFOL 10 MG/ML IV BOLUS
INTRAVENOUS | Status: DC | PRN
  Administered 2017-08-13: 200 mg via INTRAVENOUS

## 2017-08-13 MED ORDER — KETOROLAC TROMETHAMINE 30 MG/ML IJ SOLN: 30.0000 mg | Freq: Once | INTRAMUSCULAR | Status: DC | PRN

## 2017-08-13 MED ORDER — ONDANSETRON HCL 4 MG/2ML IJ SOLN
INTRAMUSCULAR | Status: DC | PRN
  Administered 2017-08-13: 4 mg via INTRAVENOUS

## 2017-08-13 MED ORDER — GLYCOPYRROLATE 0.2 MG/ML IJ SOLN
INTRAMUSCULAR | Status: AC
  Filled 2017-08-13: qty 1

## 2017-08-13 MED ORDER — FENTANYL CITRATE (PF) 100 MCG/2ML IJ SOLN
50.0000 ug | Freq: Once | INTRAMUSCULAR | Status: AC
  Administered 2017-08-13: 50 ug via INTRAVENOUS
  Filled 2017-08-13: qty 2

## 2017-08-13 MED ORDER — DOCUSATE SODIUM 100 MG PO CAPS
100.0000 mg | ORAL_CAPSULE | Freq: Two times a day (BID) | ORAL | Status: DC
  Administered 2017-08-13 – 2017-08-14 (×2): 100 mg via ORAL
  Filled 2017-08-13 (×2): qty 1

## 2017-08-13 MED ORDER — LACTATED RINGERS IV SOLN
INTRAVENOUS | Status: DC
  Administered 2017-08-13: 1000 mL via INTRAVENOUS

## 2017-08-13 MED ORDER — DOCUSATE SODIUM 100 MG PO CAPS: 100.0000 mg | ORAL_CAPSULE | Freq: Two times a day (BID) | ORAL | 2 refills | Status: DC

## 2017-08-13 MED ORDER — SCOPOLAMINE 1 MG/3DAYS TD PT72
1.0000 | MEDICATED_PATCH | TRANSDERMAL | Status: DC
  Administered 2017-08-13: 1.5 mg via TRANSDERMAL

## 2017-08-13 MED ORDER — CHLORHEXIDINE GLUCONATE CLOTH 2 % EX PADS: 6.0000 | MEDICATED_PAD | Freq: Once | CUTANEOUS | Status: DC

## 2017-08-13 MED ORDER — ONDANSETRON 4 MG PO TBDP: 4.0000 mg | ORAL_TABLET | Freq: Four times a day (QID) | ORAL | Status: DC | PRN

## 2017-08-13 MED ORDER — DEXAMETHASONE SODIUM PHOSPHATE 4 MG/ML IJ SOLN
INTRAMUSCULAR | Status: DC | PRN
  Administered 2017-08-13: 8 mg via INTRAVENOUS

## 2017-08-13 MED ORDER — DIPHENHYDRAMINE HCL 50 MG/ML IJ SOLN: 12.5000 mg | Freq: Four times a day (QID) | INTRAMUSCULAR | Status: DC | PRN

## 2017-08-13 MED ORDER — HEPARIN SODIUM (PORCINE) 5000 UNIT/ML IJ SOLN
5000.0000 [IU] | Freq: Three times a day (TID) | INTRAMUSCULAR | Status: DC
  Administered 2017-08-13 – 2017-08-14 (×2): 5000 [IU] via SUBCUTANEOUS
  Filled 2017-08-13 (×2): qty 1

## 2017-08-13 MED ORDER — ONDANSETRON HCL 4 MG/2ML IJ SOLN: 4.0000 mg | Freq: Once | INTRAMUSCULAR | Status: DC | PRN

## 2017-08-13 MED ORDER — SUCCINYLCHOLINE CHLORIDE 20 MG/ML IJ SOLN
INTRAMUSCULAR | Status: AC
  Filled 2017-08-13: qty 1

## 2017-08-13 MED ORDER — PANTOPRAZOLE SODIUM 40 MG PO TBEC
40.0000 mg | DELAYED_RELEASE_TABLET | Freq: Every day | ORAL | Status: DC
  Administered 2017-08-14: 40 mg via ORAL
  Filled 2017-08-13: qty 1

## 2017-08-13 MED ORDER — SCOPOLAMINE 1 MG/3DAYS TD PT72
MEDICATED_PATCH | TRANSDERMAL | Status: AC
  Filled 2017-08-13: qty 1

## 2017-08-13 MED ORDER — ONDANSETRON HCL 4 MG/2ML IJ SOLN: 4.0000 mg | Freq: Four times a day (QID) | INTRAMUSCULAR | Status: DC | PRN

## 2017-08-13 MED ORDER — ACETAMINOPHEN 325 MG PO TABS: 650.0000 mg | ORAL_TABLET | Freq: Four times a day (QID) | ORAL | Status: DC | PRN

## 2017-08-13 MED ORDER — FENTANYL CITRATE (PF) 250 MCG/5ML IJ SOLN
INTRAMUSCULAR | Status: AC
  Filled 2017-08-13: qty 5

## 2017-08-13 MED ORDER — SODIUM CHLORIDE 0.9 % IV SOLN
INTRAVENOUS | Status: DC
  Administered 2017-08-13: 12:00:00 via INTRAVENOUS

## 2017-08-13 MED ORDER — ONDANSETRON HCL 4 MG/2ML IJ SOLN
INTRAMUSCULAR | Status: AC
  Filled 2017-08-13: qty 2

## 2017-08-13 MED ORDER — METOPROLOL TARTRATE 5 MG/5ML IV SOLN: 5.0000 mg | Freq: Four times a day (QID) | INTRAVENOUS | Status: DC | PRN

## 2017-08-13 MED ORDER — MORPHINE SULFATE (PF) 2 MG/ML IV SOLN: 2.0000 mg | INTRAVENOUS | Status: DC | PRN

## 2017-08-13 MED ORDER — LIDOCAINE HCL (CARDIAC) PF 100 MG/5ML IV SOSY
PREFILLED_SYRINGE | INTRAVENOUS | Status: DC | PRN
  Administered 2017-08-13: 50 mg via INTRAVENOUS

## 2017-08-13 MED ORDER — NEOSTIGMINE METHYLSULFATE 10 MG/10ML IV SOLN
INTRAVENOUS | Status: AC
  Filled 2017-08-13: qty 1

## 2017-08-13 MED ORDER — DEXAMETHASONE SODIUM PHOSPHATE 4 MG/ML IJ SOLN
INTRAMUSCULAR | Status: AC
  Filled 2017-08-13: qty 2

## 2017-08-13 MED ORDER — BUPIVACAINE HCL (PF) 0.5 % IJ SOLN
INTRAMUSCULAR | Status: AC
  Filled 2017-08-13: qty 30

## 2017-08-13 MED ORDER — OXYCODONE HCL 5 MG PO TABS
5.0000 mg | ORAL_TABLET | ORAL | Status: DC | PRN
  Administered 2017-08-13: 5 mg via ORAL
  Filled 2017-08-13: qty 2

## 2017-08-13 MED ORDER — MIDAZOLAM HCL 2 MG/2ML IJ SOLN
INTRAMUSCULAR | Status: AC
  Filled 2017-08-13: qty 2

## 2017-08-13 MED ORDER — HEMOSTATIC AGENTS (NO CHARGE) OPTIME
TOPICAL | Status: DC | PRN
  Administered 2017-08-13: 1 via TOPICAL

## 2017-08-13 MED ORDER — SODIUM CHLORIDE 0.9 % IR SOLN
Status: DC | PRN
  Administered 2017-08-13: 1000 mL

## 2017-08-13 MED ORDER — GLYCOPYRROLATE 0.2 MG/ML IJ SOLN
INTRAMUSCULAR | Status: DC | PRN
  Administered 2017-08-13 (×2): 0.2 mg via INTRAVENOUS

## 2017-08-13 MED ORDER — HYDROCODONE-ACETAMINOPHEN 7.5-325 MG PO TABS: 1.0000 | ORAL_TABLET | Freq: Once | ORAL | Status: DC | PRN

## 2017-08-13 MED ORDER — SUCCINYLCHOLINE CHLORIDE 20 MG/ML IJ SOLN
INTRAMUSCULAR | Status: DC | PRN
  Administered 2017-08-13: 120 mg via INTRAVENOUS

## 2017-08-13 MED ORDER — DIPHENHYDRAMINE HCL 12.5 MG/5ML PO ELIX: 12.5000 mg | ORAL_SOLUTION | Freq: Four times a day (QID) | ORAL | Status: DC | PRN

## 2017-08-13 MED ORDER — NEOSTIGMINE METHYLSULFATE 10 MG/10ML IV SOLN
INTRAVENOUS | Status: DC | PRN
  Administered 2017-08-13: 4 mg via INTRAVENOUS

## 2017-08-13 MED ORDER — ROCURONIUM BROMIDE 100 MG/10ML IV SOLN
INTRAVENOUS | Status: DC | PRN
  Administered 2017-08-13: 15 mg via INTRAVENOUS

## 2017-08-13 MED ORDER — ONDANSETRON HCL 4 MG/2ML IJ SOLN
4.0000 mg | Freq: Once | INTRAMUSCULAR | Status: AC
  Administered 2017-08-13: 4 mg via INTRAVENOUS
  Filled 2017-08-13: qty 2

## 2017-08-13 MED ORDER — ACETAMINOPHEN 650 MG RE SUPP: 650.0000 mg | Freq: Four times a day (QID) | RECTAL | Status: DC | PRN

## 2017-08-13 MED ORDER — LORATADINE 10 MG PO TABS
10.0000 mg | ORAL_TABLET | Freq: Every day | ORAL | Status: DC
  Administered 2017-08-14: 10 mg via ORAL
  Filled 2017-08-13: qty 1

## 2017-08-13 MED ORDER — KETOROLAC TROMETHAMINE 30 MG/ML IJ SOLN
INTRAMUSCULAR | Status: DC | PRN
  Administered 2017-08-13: 30 mg via INTRAVENOUS

## 2017-08-13 MED ORDER — MEPERIDINE HCL 50 MG/ML IJ SOLN: 6.2500 mg | INTRAMUSCULAR | Status: DC | PRN

## 2017-08-13 MED ORDER — ROCURONIUM BROMIDE 50 MG/5ML IV SOLN
INTRAVENOUS | Status: AC
  Filled 2017-08-13: qty 2

## 2017-08-13 MED ORDER — BUPIVACAINE HCL (PF) 0.5 % IJ SOLN
INTRAMUSCULAR | Status: DC | PRN
  Administered 2017-08-13: 10 mL

## 2017-08-13 MED ORDER — CIPROFLOXACIN IN D5W 400 MG/200ML IV SOLN
400.0000 mg | INTRAVENOUS | Status: AC
  Administered 2017-08-13: 400 mg via INTRAVENOUS
  Filled 2017-08-13: qty 200

## 2017-08-13 MED ORDER — LIDOCAINE HCL (PF) 1 % IJ SOLN
INTRAMUSCULAR | Status: AC
  Filled 2017-08-13: qty 10

## 2017-08-13 MED ORDER — LEVOTHYROXINE SODIUM 50 MCG PO TABS
50.0000 ug | ORAL_TABLET | Freq: Every day | ORAL | Status: DC
  Administered 2017-08-14: 50 ug via ORAL
  Filled 2017-08-13: qty 1

## 2017-08-13 MED ORDER — DIPHENHYDRAMINE HCL 50 MG/ML IJ SOLN
INTRAMUSCULAR | Status: DC | PRN
  Administered 2017-08-13 (×2): 25 mg via INTRAVENOUS

## 2017-08-13 MED ORDER — ZOLPIDEM TARTRATE 5 MG PO TABS
5.0000 mg | ORAL_TABLET | Freq: Every day | ORAL | Status: DC
  Administered 2017-08-13: 5 mg via ORAL
  Filled 2017-08-13: qty 1

## 2017-08-13 MED ORDER — HYDROMORPHONE HCL 1 MG/ML IJ SOLN
0.2500 mg | INTRAMUSCULAR | Status: DC | PRN
  Administered 2017-08-13 (×3): 0.5 mg via INTRAVENOUS
  Filled 2017-08-13 (×3): qty 0.5

## 2017-08-13 MED ORDER — KETOROLAC TROMETHAMINE 30 MG/ML IJ SOLN
INTRAMUSCULAR | Status: AC
  Filled 2017-08-13: qty 1

## 2017-08-13 MED ORDER — OXYCODONE HCL 5 MG PO TABS: 5.0000 mg | ORAL_TABLET | ORAL | 0 refills | Status: DC | PRN

## 2017-08-13 MED ORDER — SIMETHICONE 80 MG PO CHEW: 40.0000 mg | CHEWABLE_TABLET | Freq: Four times a day (QID) | ORAL | Status: DC | PRN

## 2017-08-13 MED ORDER — FENTANYL CITRATE (PF) 100 MCG/2ML IJ SOLN
INTRAMUSCULAR | Status: DC | PRN
  Administered 2017-08-13: 50 ug via INTRAVENOUS
  Administered 2017-08-13: 100 ug via INTRAVENOUS

## 2017-08-13 MED ORDER — DIPHENHYDRAMINE HCL 50 MG/ML IJ SOLN
INTRAMUSCULAR | Status: AC
  Filled 2017-08-13: qty 1

## 2017-08-13 SURGICAL SUPPLY — 44 items
ADH SKN CLS APL DERMABOND .7 (GAUZE/BANDAGES/DRESSINGS) ×1
APPLIER CLIP ROT 10 11.4 M/L (STAPLE) ×2
APR CLP MED LRG 11.4X10 (STAPLE) ×1
BAG RETRIEVAL 10 (BASKET) ×1
BLADE SURG 15 STRL LF DISP TIS (BLADE) ×1 IMPLANT
BLADE SURG 15 STRL SS (BLADE) ×2
CHLORAPREP W/TINT 26ML (MISCELLANEOUS) ×2 IMPLANT
CLIP APPLIE ROT 10 11.4 M/L (STAPLE) ×1 IMPLANT
CLOTH BEACON ORANGE TIMEOUT ST (SAFETY) ×2 IMPLANT
COVER LIGHT HANDLE STERIS (MISCELLANEOUS) ×4 IMPLANT
DECANTER SPIKE VIAL GLASS SM (MISCELLANEOUS) ×2 IMPLANT
DERMABOND ADVANCED (GAUZE/BANDAGES/DRESSINGS) ×1
DERMABOND ADVANCED .7 DNX12 (GAUZE/BANDAGES/DRESSINGS) ×1 IMPLANT
ELECT REM PT RETURN 9FT ADLT (ELECTROSURGICAL) ×2
ELECTRODE REM PT RTRN 9FT ADLT (ELECTROSURGICAL) ×1 IMPLANT
FILTER SMOKE EVAC LAPAROSHD (FILTER) ×2 IMPLANT
GLOVE BIO SURGEON STRL SZ 6.5 (GLOVE) ×2 IMPLANT
GLOVE BIOGEL M 7.0 STRL (GLOVE) ×2 IMPLANT
GLOVE BIOGEL PI IND STRL 6.5 (GLOVE) ×1 IMPLANT
GLOVE BIOGEL PI IND STRL 7.0 (GLOVE) ×2 IMPLANT
GLOVE BIOGEL PI INDICATOR 6.5 (GLOVE) ×1
GLOVE BIOGEL PI INDICATOR 7.0 (GLOVE) ×3
GOWN STRL REUS W/TWL LRG LVL3 (GOWN DISPOSABLE) ×6 IMPLANT
HEMOSTAT SNOW SURGICEL 2X4 (HEMOSTASIS) ×2 IMPLANT
INST SET LAPROSCOPIC AP (KITS) ×2 IMPLANT
KIT TURNOVER KIT A (KITS) ×2 IMPLANT
MANIFOLD NEPTUNE II (INSTRUMENTS) ×2 IMPLANT
NDL INSUFFLATION 14GA 120MM (NEEDLE) ×1 IMPLANT
NEEDLE INSUFFLATION 14GA 120MM (NEEDLE) ×2 IMPLANT
NS IRRIG 1000ML POUR BTL (IV SOLUTION) ×2 IMPLANT
PACK LAP CHOLE LZT030E (CUSTOM PROCEDURE TRAY) ×2 IMPLANT
PAD ARMBOARD 7.5X6 YLW CONV (MISCELLANEOUS) ×2 IMPLANT
SET BASIN LINEN APH (SET/KITS/TRAYS/PACK) ×2 IMPLANT
SLEEVE ENDOPATH XCEL 5M (ENDOMECHANICALS) ×2 IMPLANT
SUT MNCRL AB 4-0 PS2 18 (SUTURE) ×2 IMPLANT
SUT VICRYL 0 UR6 27IN ABS (SUTURE) ×2 IMPLANT
SYS BAG RETRIEVAL 10MM (BASKET) ×1
SYSTEM BAG RETRIEVAL 10MM (BASKET) ×1 IMPLANT
TROCAR ENDO BLADELESS 11MM (ENDOMECHANICALS) ×2 IMPLANT
TROCAR XCEL NON-BLD 5MMX100MML (ENDOMECHANICALS) ×2 IMPLANT
TROCAR XCEL UNIV SLVE 11M 100M (ENDOMECHANICALS) ×2 IMPLANT
TUBE CONNECTING 12X1/4 (SUCTIONS) ×2 IMPLANT
TUBING INSUFFLATION (TUBING) ×2 IMPLANT
WARMER LAPAROSCOPE (MISCELLANEOUS) ×2 IMPLANT

## 2017-08-13 NOTE — Anesthesia Procedure Notes (Signed)
Performed by: Tavonte Seybold E, CRNA       

## 2017-08-13 NOTE — Transfer of Care (Signed)
Immediate Anesthesia Transfer of Care Note  Patient: Kristine Melton  Procedure(s) Performed: LAPAROSCOPIC CHOLECYSTECTOMY (N/A )  Patient Location: PACU  Anesthesia Type:General  Level of Consciousness: awake, alert , oriented and patient cooperative  Airway & Oxygen Therapy: Patient Spontanous Breathing  Post-op Assessment: Report given to RN and Post -op Vital signs reviewed and stable  Post vital signs: Reviewed and stable  Last Vitals:  Vitals Value Taken Time  BP 103/78 08/13/2017  4:15 PM  Temp    Pulse 59 08/13/2017  4:18 PM  Resp 11 08/13/2017  4:18 PM  SpO2 100 % 08/13/2017  4:18 PM  Vitals shown include unvalidated device data.  Last Pain:  Vitals:   08/13/17 1457  TempSrc: Oral  PainSc: 3       Patients Stated Pain Goal: 9 (93/55/21 7471)  Complications: No apparent anesthesia complications

## 2017-08-13 NOTE — Addendum Note (Signed)
Addendum  created 08/13/17 1641 by Vista Deck, CRNA   Intraprocedure Event edited

## 2017-08-13 NOTE — Addendum Note (Signed)
Addendum  created 08/13/17 1717 by Vista Deck, CRNA   Intraprocedure Meds edited

## 2017-08-13 NOTE — H&P (Signed)
[facility] History and Physical  Reason for Referral: Early Acute Cholecystitis? Intractable pain from Cholelithiasis  Referring Physician:  Dr. Sabra Heck   Chief Complaint    Flank Pain      Kristine Melton is a 52 y.o. female.  HPI: Kristine Melton is a 52 yo who came to Kristine ED with reports of pain that first started yesterday in Kristine lower abdomen when Kristine Melton urinated and then has become constant and progressive in Kristine RUQ. Kristine Melton reports that Kristine Melton had to leave work due to Kristine pain in Kristine RUQ, and that in Kristine past Kristine Melton has had episodes of pain with eating fatty foods. These pains are transient and have never lasted this long. Kristine Melton says Kristine pain has continued to be in this area and has not improved.    Kristine Melton was evaluated in Kristine ED and found to have stones with possible positive Murphy's sign on Kristine [place] but no obvious thickening or fluid. Her labs are reassuring. Kristine Melton denies any fevers or chills.   Past Medical History:  Diagnosis Date  . Atypical migraine   . Cancer (HCC)    Skin  . Hypothyroidism   . PVC's (premature ventricular contractions)   . Vertigo     Past Surgical History:  Procedure Laterality Date  . SALPINGOOPHORECTOMY     LEFT  . VAGINAL HYSTERECTOMY      Family History  Problem Relation Age of Onset  . Hypothyroidism Mother   . Arrhythmia Mother        PVC's  . Fibromyalgia Mother   . Macular degeneration Mother   . Migraines Mother   . Hypothyroidism Sister     Social History   Tobacco Use  . Smoking status: Never Smoker  . Smokeless tobacco: Never Used  Substance Use Topics  . Alcohol use: Yes    Alcohol/week: 0.0 oz    Comment: Occasional  . Drug use: No    Medications: I have reviewed Kristine Melton's current medications. Current Facility-Administered Medications  Medication Dose Route Frequency Provider Last Rate Last Dose  . 0.9 %  sodium chloride infusion   Intravenous Continuous Noemi Chapel, MD 250 mL/hr at 08/13/17 1141    .  Chlorhexidine Gluconate Cloth 2 % PADS 6 each  6 each Topical Once Virl Cagey, MD      . Derrill Memo ON 08/14/2017] ciprofloxacin (CIPRO) IVPB 400 mg  400 mg Intravenous On Call to [place], MD       Current Outpatient Medications  Medication Sig Dispense Refill Last Dose  . Carboxymethylcellul-Glycerin (CLEAR EYES FOR DRY EYES) 1-0.25 % SOLN Apply 1 drop to eye daily as needed.   08/13/2017 at 0300  . Cholecalciferol (VITAMIN D-3) 5000 units TABS Take 1 capsule by mouth daily.    08/13/2017 at 0300  . diclofenac sodium (VOLTAREN) 1 % GEL Apply topically 4 (four) times daily.   Taking  . levothyroxine (SYNTHROID, LEVOTHROID) 50 MCG tablet Take 50 mcg by mouth daily.   08/13/2017 at 0200  . loratadine (CLARITIN) 10 MG tablet Take 10 mg by mouth daily.   08/13/2017 at 0300  . Multiple Vitamin (MULTIVITAMIN) tablet Take 1 tablet by mouth daily.   08/13/2017 at 0300  . NON FORMULARY [facility]  Achilles Tendonitis Cream- Diclofenac 3%, Baclofen 2%, Bupivacaine 1%, Doxepin 5%, Gabapentin 6%, Ibuprofen 3%, Pentoxifylline 3% Apply 1-2 grams to affected area 3-4 times daily Qty. 120 gm 3 refills   Taking  . pantoprazole (PROTONIX) 40  MG tablet Take 40 mg by mouth daily.    08/13/2017 at 0300  . zolpidem (AMBIEN) 10 MG tablet Take 1 tablet by mouth at bedtime.   Past Week at Unknown time  . estradiol (VIVELLE-DOT) 0.1 MG/24HR patch Place 1 patch onto Kristine skin 2 (two) times a week.   08/11/2017   Allergies  Allergen Reactions  . Sulfa Antibiotics Hives  . Amoxicillin   . Diclofenac     Makes pt sleepy  . Penicillins Rash    .Has Melton had a PCN reaction causing immediate rash, facial/tongue/throat swelling, SOB or lightheadedness with hypotension: Yes Has Melton had a PCN reaction causing severe rash involving mucus membranes or skin necrosis: No Has Melton had a PCN reaction that required hospitalization: Unknown Has Melton had a PCN reaction occurring within Kristine last 10 years:  No If all of Kristine above answers are "NO", then may proceed with Cephalosporin use.     ROS:  A comprehensive review of systems was negative except for: Gastrointestinal: positive for abdominal pain and RUQ Genitourinary: positive for dysuria  Height '5\' 7"'$  (1.702 m), weight 236 lb (107 kg). Physical Exam  Results: Results for orders placed or performed during Kristine hospital encounter of 08/13/17 (from Kristine past 48 hour(s))  Lipase, blood     Status: None   Collection Time: 08/13/17 11:23 AM  Result Value Ref Range   Lipase 34 11 - 51 U/L    Comment: Performed at [facility], [address] [place], [place] [address]  Comprehensive metabolic panel     Status: None   Collection Time: 08/13/17 11:23 AM  Result Value Ref Range   Sodium 138 135 - 145 mmol/L   Potassium 4.1 3.5 - 5.1 mmol/L   Chloride 103 101 - 111 mmol/L   CO2 27 22 - 32 mmol/L   Glucose, Bld 89 65 - 99 mg/dL   BUN 15 6 - 20 mg/dL   Creatinine, Ser 0.81 0.44 - 1.00 mg/dL   Calcium 9.2 8.9 - 10.3 mg/dL   Total Protein 7.4 6.5 - 8.1 g/dL   Albumin 4.4 3.5 - 5.0 g/dL   AST 25 15 - 41 U/L   ALT 24 14 - 54 U/L   Alkaline Phosphatase 60 38 - 126 U/L   Total Bilirubin 1.0 0.3 - 1.2 mg/dL   GFR calc non Af Amer >60 >60 mL/min   GFR calc Af Amer >60 >60 mL/min    Comment: (NOTE) Kristine eGFR has been calculated using Kristine CKD EPI equation. This calculation has not been validated in all clinical situations. eGFR's persistently <60 mL/min signify possible Chronic Kidney Disease.    Anion gap 8 5 - 15    Comment: Performed at [facility], [address] [place], [place] [address]  CBC with Differential/Platelet     Status: None   Collection Time: 08/13/17 11:23 AM  Result Value Ref Range   WBC 6.2 4.0 - 10.5 K/uL   RBC 4.62 3.87 - 5.11 MIL/uL   Hemoglobin 14.6 12.0 - 15.0 g/dL   HCT 44.3 36.0 - 46.0 %   MCV 95.9 78.0 - 100.0 fL   MCH 31.6 26.0 - 34.0 pg   MCHC 33.0 30.0 - 36.0 g/dL   RDW 12.1 11.5 - 15.5 %   Platelets  221 150 - 400 K/uL   Neutrophils Relative % 62 %   Neutro Abs 3.9 1.7 - 7.7 K/uL   Lymphocytes Relative 24 %   Lymphs Abs 1.5 0.7 -  4.0 K/uL   Monocytes Relative 10 %   Monocytes Absolute 0.6 0.1 - 1.0 K/uL   Eosinophils Relative 3 %   Eosinophils Absolute 0.2 0.0 - 0.7 K/uL   Basophils Relative 1 %   Basophils Absolute 0.0 0.0 - 0.1 K/uL    Comment: Performed at [facility], [address] [place], [place] [address]   CBD wnl, gallstones  US Abdomen Limited  Result Date: 08/13/2017 CLINICAL DATA:  51 year old female with right upper quadrant abdominal pain since last night. EXAM: ULTRASOUND ABDOMEN LIMITED RIGHT UPPER QUADRANT COMPARISON:  Lumbar MRI 12/17/2009. FINDINGS: Gallbladder: Small mobile appearing gallstones individually estimated at 7 millimeters (image 2). Gallbladder wall thickness remains within normal limits, 3 millimeters or less. No pericholecystic fluid identified. However, positive sonographic Murphy sign was elicited. Common bile duct: Diameter: 4 millimeters, normal. Liver: Mildly to moderately increased echogenicity (image 27). No discrete liver lesion or intrahepatic biliary ductal dilatation. Portal vein is patent on color Doppler imaging with normal direction of blood flow towards Kristine liver. Other findings: Negative visible right kidney. IMPRESSION: 1. Positive for cholelithiasis and also sonographic Murphy sign. However, no convincing abnormal gallbladder wall thickening. Consider Kristine possibility of early acute cholecystitis. 2. No evidence of biliary duct obstruction. 3. Fatty liver disease. Electronically Signed   By: Genevie Ann M.D.   On: 08/13/2017 12:11     Assessment & Plan:  BEDIE DOMINEY is a 52 y.o. female with either early acute cholecystitis versus symptomatic cholelithiasis that is not improving with pain meds. After a thorough discussion, Kristine Melton has had prior episodes and Kristine episodes are occurring more frequently. This is Kristine worst episode Kristine Melton has  had to date.  We discussed Kristine option of po trial in ED and going home with follow up for elective cholecystectomy and return to ED if worsening versus laparoscopic cholecystectomy this afternoon. Kristine Melton had decided to proceed with cholecystectomy.   -UA ordered given some dysuria report -CXR/ EKG ordered for preop -Consent obtained   -PLAN: I counseled Kristine Melton about Kristine indication, risks and benefits of laparoscopic cholecystectomy.  Kristine Melton understands there is a very small chance for bleeding, infection, injury to normal structures (including common bile duct), conversion to open surgery, persistent symptoms, evolution of postcholecystectomy diarrhea, need for secondary interventions, anesthesia reaction, cardiopulmonary issues and other risks not specifically detailed here. I described Kristine expected recovery, Kristine plan for follow-up and Kristine restrictions during Kristine recovery phase.  All questions were answered.  All questions were answered to Kristine satisfaction of Kristine Melton and family.  Virl Cagey 08/13/2017, 2:32 PM

## 2017-08-13 NOTE — Progress Notes (Signed)
Patient experienced severe itching for the duration of her PACU stay.  No visible rash noted, however, skin red where scratching had occurred.  Benadryl was given in divided doses by anesthesia with no effect.  Dr Constance Haw aware.

## 2017-08-13 NOTE — Discharge Instructions (Signed)
Discharge Instructions: Shower per your regular routine. Take tylenol and ibuprofen as needed for pain control, alternating every 4-6 hours.  Take Roxicodone for breakthrough pain. Take colace for constipation related to narcotic pain medication. Do not pick at the dermabond glue on your incision sites.   Laparoscopic Cholecystectomy, Care After This sheet gives you information about how to care for yourself after your procedure. Your doctor may also give you more specific instructions. If you have problems or questions, contact your doctor. Follow these instructions at home: Care for cuts from surgery (incisions)   Follow instructions from your doctor about how to take care of your cuts from surgery. Make sure you: ? Wash your hands with soap and water before you change your bandage (dressing). If you cannot use soap and water, use hand sanitizer. ? Change your bandage as told by your doctor. ? Leave stitches (sutures), skin glue, or skin tape (adhesive) strips in place. They may need to stay in place for 2 weeks or longer. If tape strips get loose and curl up, you may trim the loose edges. Do not remove tape strips completely unless your doctor says it is okay.  Do not take baths, swim, or use a hot tub until your doctor says it is okay. Ask your doctor if you can take showers. You may only be allowed to take sponge baths for bathing.  Check your surgical cut area every day for signs of infection. Check for: ? More redness, swelling, or pain. ? More fluid or blood. ? Warmth. ? Pus or a bad smell. Activity  Do not drive or use heavy machinery while taking prescription pain medicine.  Do not lift anything that is heavier than 10 lb (4.5 kg) for the first week.   Do not play contact sports until your doctor says it is okay.  Do not drive for 24 hours if you were given a medicine to help you relax (sedative).  Rest as needed. Do not return to work or school until your doctor says it  is okay. General instructions  Take over-the-counter and prescription medicines only as told by your doctor.  To prevent or treat constipation while you are taking prescription pain medicine, your doctor may recommend that you: ? Drink enough fluid to keep your pee (urine) clear or pale yellow. ? Take over-the-counter or prescription medicines. ? Eat foods that are high in fiber, such as fresh fruits and vegetables, whole grains, and beans. ? Limit foods that are high in fat and processed sugars, such as fried and sweet foods. Contact a doctor if:  You develop a rash.  You have more redness, swelling, or pain around your surgical cuts.  You have more fluid or blood coming from your surgical cuts.  Your surgical cuts feel warm to the touch.  You have pus or a bad smell coming from your surgical cuts.  You have a fever.  One or more of your surgical cuts breaks open. Get help right away if:  You have trouble breathing.  You have chest pain.  You have pain that is getting worse in your shoulders.  You faint or feel dizzy when you stand.  You have very bad pain in your belly (abdomen).  You are sick to your stomach (nauseous) for more than one day.  You have throwing up (vomiting) that lasts for more than one day.  You have leg pain. This information is not intended to replace advice given to you by your health care  provider. Make sure you discuss any questions you have with your health care provider. Document Released: 12/03/2007 Document Revised: 09/14/2015 Document Reviewed: 08/12/2015 Elsevier Interactive Patient Education  2018 [employer].

## 2017-08-13 NOTE — Anesthesia Postprocedure Evaluation (Signed)
Anesthesia Post Note  Patient: Kristine Melton  Procedure(s) Performed: LAPAROSCOPIC CHOLECYSTECTOMY (N/A )  Patient location during evaluation: PACU Anesthesia Type: General Level of consciousness: awake and alert Pain management: pain level controlled Vital Signs Assessment: post-procedure vital signs reviewed and stable Respiratory status: spontaneous breathing, nonlabored ventilation and respiratory function stable Cardiovascular status: blood pressure returned to baseline and stable Postop Assessment: no apparent nausea or vomiting Anesthetic complications: no     Last Vitals:  Vitals:   08/13/17 1457  BP: 129/75  Pulse: 67  Resp: 18  Temp: 37 C  SpO2: 99%    Last Pain:  Vitals:   08/13/17 1457  TempSrc: Oral  PainSc: 3                  Nicanor Alcon

## 2017-08-13 NOTE — Addendum Note (Signed)
Addendum  created 08/13/17 1701 by Vista Deck, CRNA   Intraprocedure Event edited, Intraprocedure Meds edited

## 2017-08-13 NOTE — Anesthesia Preprocedure Evaluation (Addendum)
Anesthesia Evaluation  Patient identified by MRN, date of birth, ID band Patient awake    Reviewed: Allergy & Precautions, H&P , NPO status , Patient's Chart, lab work & pertinent test results  Airway Mallampati: II  TM Distance: >3 FB Neck ROM: full    Dental no notable dental hx.    Pulmonary neg pulmonary ROS,    Pulmonary exam normal breath sounds clear to auscultation       Cardiovascular Exercise Tolerance: Good negative cardio ROS Normal cardiovascular exam Rhythm:regular Rate:Normal  PVC's (premature ventricular contractions)   Neuro/Psych  Headaches, negative neurological ROS  negative psych ROS   GI/Hepatic negative GI ROS, Neg liver ROS,   Endo/Other  negative endocrine ROSHypothyroidism   Renal/GU negative Renal ROS  negative genitourinary   Musculoskeletal   Abdominal   Peds  Hematology negative hematology ROS (+)   Anesthesia Other Findings   Reproductive/Obstetrics negative OB ROS                            Anesthesia Physical Anesthesia Plan  ASA: II and emergent  Anesthesia Plan: General   Post-op Pain Management:    Induction:   PONV Risk Score and Plan:   Airway Management Planned:   Additional Equipment:   Intra-op Plan:   Post-operative Plan:   Informed Consent: I have reviewed the patients History and Physical, chart, labs and discussed the procedure including the risks, benefits and alternatives for the proposed anesthesia with the patient or authorized representative who has indicated his/her understanding and acceptance.   Dental Advisory Given  Plan Discussed with: CRNA  Anesthesia Plan Comments:         Anesthesia Quick Evaluation

## 2017-08-13 NOTE — Op Note (Signed)
Operative Note   Preoperative Diagnosis: Acute cholecystitis    Postoperative Diagnosis: Same   Procedure(s) Performed: Laparoscopic cholecystectomy   Surgeon: Ria Comment C. Constance Haw, MD   Assistants: No qualified resident was available   Anesthesia: General endotracheal   Anesthesiologist: Nicanor Alcon, MD    Specimens: Gallbladder    Estimated Blood Loss: Minimal    Blood Replacement: None    Complications: None    Operative Findings: Inflamed, distended gallbladder    Procedure: The patient was taken to the operating room and placed supine. General endotracheal anesthesia was induced. Intravenous antibiotics were administered per protocol. An orogastric tube positioned to decompress the stomach. The abdomen was prepared and draped in the usual sterile fashion.    A supraumbilical incision was made and a Veress technique was utilized to achieve pneumoperitoneum to 15 mmHg with carbon dioxide. A 11 mm optiview port was placed through the supraumbilical region, and a 10 mm 0-degree operative laparoscope was introduced. The area underlying the trocar and Veress needle were inspected and without evidence of injury.  Remaining trocars were placed under direct vision. Two 5 mm ports were placed in the right abdomen, between the anterior axillary and midclavicular line.  A final 11 mm port was placed through the mid-epigastrium, near the falciform ligament.  The gallbladder was distended and inflamed.    The gallbladder fundus was elevated cephalad and the infundibulum was retracted to the patient's right. The gallbladder/cystic duct junction was skeletonized. The cystic artery noted in the triangle of Calot and was also skeletonized.  We then continued liberal medial and lateral dissection until the critical view of safety was achieved.    The cystic duct and cystic artery were doubly clipped and divided. The gallbladder was then dissected from the liver bed with electrocautery. The  specimen was placed in an Endopouch and was retrieved through the epigastric site.   Final inspection revealed acceptable hemostasis. Surgical Emogene Morgan was placed in the gallbladder bed. 0 Vicryl fascial sutures were used to close the epigastric and the umbilical port site was too small and tangential to close. Trocars were removed and pneumoperitoneum was released. Skin incisions were closed with 4-0 Monocryl subcuticular sutures and Dermabond. The patient was awakened from anesthesia and extubated without complication.    Curlene Labrum, MD [facility] [address] [place], [place] [id number] [phone] (office)

## 2017-08-13 NOTE — ED Provider Notes (Signed)
[facility] EMERGENCY DEPARTMENT Provider Note   CSN: [id number] Arrival date & time: 08/13/17  1046     History   Chief Complaint Chief Complaint  Patient presents with  . Flank Pain    HPI Kristine Melton is a 52 y.o. female.  HPI  The patient is a 52 year old female, she has a known history of a hysterectomy in the past, she has never had any other significant abdominal surgery, she still has an ovary on the right.  She states that over the last 2 weeks she has developed some postprandial discomfort as well as some discomfort in her abdomen at night but this was mild until yesterday when she started having some dysuria, this morning when she started having some right flank and right upper quadrant pain.  Her pain is leveled at 4 out of 10, persistent, worse with palpation, not associated with vomiting or diarrhea.  She was seen at the urgent care this morning where she had a urinalysis which was totally normal without any signs of infection and an x-ray that showed a moderate stool burden.  She was sent to the emergency department for further evaluation of her right upper quadrant pain to make sure it was not cholecystitis or some other surgical cause of her symptoms.  The patient significant others in the room who corroborates the story  Electronic medical record reviewed including the visit from today as well as the paperwork that the patient brought to confirm the urinalysis results as well as her x-ray results.  Past Medical History:  Diagnosis Date  . Atypical migraine   . Cancer (HCC)    Skin  . Hypothyroidism   . PVC's (premature ventricular contractions)   . Vertigo     Patient Active Problem List   Diagnosis Date Noted  . PVC's (premature ventricular contractions) 07/17/2011  . Hypothyroidism 07/17/2011    Past Surgical History:  Procedure Laterality Date  . SALPINGOOPHORECTOMY     LEFT  . VAGINAL HYSTERECTOMY       OB History   None      Home  Medications    Prior to Admission medications   Medication Sig Start Date End Date Taking? Authorizing Provider  Carboxymethylcellul-Glycerin (CLEAR EYES FOR DRY EYES) 1-0.25 % SOLN Apply 1 drop to eye daily as needed.   Yes [provider]  Cholecalciferol (VITAMIN D-3) 5000 units TABS Take 1 capsule by mouth daily.    Yes [provider]  diclofenac sodium (VOLTAREN) 1 % GEL Apply topically 4 (four) times daily.   Yes [provider]  levothyroxine (SYNTHROID, LEVOTHROID) 50 MCG tablet Take 50 mcg by mouth daily.   Yes [provider]  loratadine (CLARITIN) 10 MG tablet Take 10 mg by mouth daily.   Yes [provider]  Multiple Vitamin (MULTIVITAMIN) tablet Take 1 tablet by mouth daily.   Yes [provider]  NON [place]  Achilles Tendonitis Cream- Diclofenac 3%, Baclofen 2%, Bupivacaine 1%, Doxepin 5%, Gabapentin 6%, Ibuprofen 3%, Pentoxifylline 3% Apply 1-2 grams to affected area 3-4 times daily Qty. 120 gm 3 refills   Yes [provider]  pantoprazole (PROTONIX) 40 MG tablet Take 40 mg by mouth daily.    Yes [provider]  zolpidem (AMBIEN) 10 MG tablet Take 1 tablet by mouth at bedtime. 06/08/17  Yes [provider]  estradiol (VIVELLE-DOT) 0.1 MG/24HR patch Place 1 patch onto the skin 2 (two) times a week.    [provider]    Family History Family History  Problem Relation Age of Onset  . Hypothyroidism Mother   . Arrhythmia Mother        PVC's  . Fibromyalgia Mother   . Macular degeneration Mother   . Migraines Mother   . Hypothyroidism Sister     Social History Social History   Tobacco Use  . Smoking status: Never Smoker  . Smokeless tobacco: Never Used  Substance Use Topics  . Alcohol use: Yes    Alcohol/week: 0.0 oz    Comment: Occasional  . Drug use: No     Allergies   Sulfa antibiotics; Amoxicillin; Diclofenac; and Penicillins   Review of  Systems Review of Systems  All other systems reviewed and are negative.    Physical Exam Updated Vital Signs Ht 5\' 7"  (1.702 m)   Wt 107 kg (236 lb)   BMI 36.96 kg/m   Physical Exam  Constitutional: She appears well-developed and well-nourished. No distress.  HENT:  Head: Normocephalic and atraumatic.  Mouth/Throat: Oropharynx is clear and moist. No oropharyngeal exudate.  Eyes: Pupils are equal, round, and reactive to light. Conjunctivae and EOM are normal. Right eye exhibits no discharge. Left eye exhibits no discharge. No scleral icterus.  Neck: Normal range of motion. Neck supple. No JVD present. No thyromegaly present.  Cardiovascular: Normal rate, regular rhythm, normal heart sounds and intact distal pulses. Exam reveals no gallop and no friction rub.  No murmur heard. Pulmonary/Chest: Effort normal and breath sounds normal. No respiratory distress. She has no wheezes. She has no rales.  Abdominal: Soft. Bowel sounds are normal. She exhibits no distension and no mass. There is tenderness.  There is a positive Murphy sign, she has significant right upper quadrant tenderness, the rest of her abdomen is benign and very soft.  She is obese  Musculoskeletal: Normal range of motion. She exhibits no edema or tenderness.  Lymphadenopathy:    She has no cervical adenopathy.  Neurological: She is alert. Coordination normal.  Skin: Skin is warm and dry. No rash noted. No erythema.  Psychiatric: She has a normal mood and affect. Her behavior is normal.  Nursing note and vitals reviewed.    ED Treatments / Results  Labs (all labs ordered are listed, but only abnormal results are displayed) Labs Reviewed  LIPASE, BLOOD  COMPREHENSIVE METABOLIC PANEL  CBC WITH DIFFERENTIAL/PLATELET    EKG None  Radiology US Abdomen Limited  Result Date: 08/13/2017 CLINICAL DATA:  52 year old female with right upper quadrant abdominal pain since last night. EXAM: ULTRASOUND ABDOMEN LIMITED  RIGHT UPPER QUADRANT COMPARISON:  Lumbar MRI 12/17/2009. FINDINGS: Gallbladder: Small mobile appearing gallstones individually estimated at 7 millimeters (image 2). Gallbladder wall thickness remains within normal limits, 3 millimeters or less. No pericholecystic fluid identified. However, positive sonographic Murphy sign was elicited. Common bile duct: Diameter: 4 millimeters, normal. Liver: Mildly to moderately increased echogenicity (image 27). No discrete liver lesion or intrahepatic biliary ductal dilatation. Portal vein is patent on color Doppler imaging with normal direction of blood flow towards the liver. Other findings: Negative visible right kidney. IMPRESSION: 1. Positive for cholelithiasis and also sonographic Murphy sign. However, no convincing abnormal gallbladder wall thickening. Consider the possibility of early acute cholecystitis. 2. No evidence of biliary duct obstruction. 3. Fatty liver disease. Electronically Signed   By: Genevie Ann M.D.   On: 08/13/2017 12:11    Procedures Procedures (including critical care time)  Medications Ordered in ED Medications  0.9 %  sodium  chloride infusion ( Intravenous New Bag/Given 08/13/17 1141)  ciprofloxacin (CIPRO) IVPB 400 mg (has no administration in time range)  Chlorhexidine Gluconate Cloth 2 % PADS 6 each (has no administration in time range)  fentaNYL (SUBLIMAZE) injection 50 mcg (50 mcg Intravenous Given 08/13/17 1140)  ondansetron (ZOFRAN) injection 4 mg (4 mg Intravenous Given 08/13/17 1137)     Initial Impression / Assessment and Plan / ED Course  I have reviewed the triage vital signs and the nursing notes.  Pertinent labs & imaging results that were available during my care of the patient were reviewed by me and considered in my medical decision making (see chart for details).  Clinical Course as of Aug 13 1413  Fri Aug 13, 2017  1320 Discussed the care with Dr. Constance Haw at 1:20 PM, she will see the patient in consultation.  Reviewed  the labs which are normal, the ultrasound shows some gallbladder stones however no other signs of cholecystitis.  I am concerned for significant biliary colic or early cholecystitis.   [BM]    Clinical Course User Index [BM] Noemi Chapel, MD   I do not detect any specific CVA tenderness on my exam however she does have right upper quadrant tenderness with guarding, I suspect that she needs a work-up for gallbladder disease cholecystitis or choledocholithiasis.  Will order lipase, comprehensive metabolic panel and a CBC with a right upper quadrant ultrasound.  The patient is in agreement with the plan.  She last had crackers around 6:30 AM  Case was discussed with Dr. Constance Haw who will admit the patient for a laparoscopic cholecystectomy.  The patient is still having ongoing tenderness.  Final Clinical Impressions(s) / ED Diagnoses   Final diagnoses:  Acute cholecystitis    ED Discharge Orders    None       Noemi Chapel, MD 08/13/17 1415

## 2017-08-13 NOTE — Anesthesia Procedure Notes (Signed)
Procedure Name: Intubation Date/Time: 08/13/2017 3:13 PM Performed by: Ollen Bowl, CRNA Pre-anesthesia Checklist: Patient identified, Patient being monitored, Timeout performed, Emergency Drugs available and Suction available Patient Re-evaluated:Patient Re-evaluated prior to induction Oxygen Delivery Method: Circle System Utilized Preoxygenation: Pre-oxygenation with 100% oxygen Induction Type: IV induction Ventilation: Mask ventilation without difficulty Laryngoscope Size: Mac and 3 Grade View: Grade II Tube type: Oral Tube size: 7.0 mm Number of attempts: 1 Airway Equipment and Method: stylet Placement Confirmation: ETT inserted through vocal cords under direct vision,  positive ETCO2 and breath sounds checked- equal and bilateral Secured at: 21 cm Tube secured with: Tape Dental Injury: Teeth and Oropharynx as per pre-operative assessment  Comments: + cricoid press to facilitate view

## 2017-08-13 NOTE — ED Triage Notes (Signed)
Right flank pain since last night.  Burning with urination.  Pt seen at [facility] urgent care and sent here.

## 2017-08-14 NOTE — Progress Notes (Signed)
Patient is to be discharged home and in stable condition. Patient's IV removed, WNL. Patient given discharge instructions and verbalized understanding. Patient will be escorted out by staff via wheelchair.  [name] P Dishmon, RN  

## 2017-08-14 NOTE — Discharge Summary (Signed)
Physician Discharge Summary  Patient ID: Kristine Melton MRN: [id number] DOB/AGE: 54-15-1967 54 y.o.  Admit date: 08/13/2017 Discharge date: 08/14/2017  Admission Diagnoses: Acute cholecystitis   Discharge Diagnoses:  Active Problems:   Acute cholecystitis   Discharged Condition: good  Hospital Course: Kristine Melton was seen in the Ed yesterday and had concerns for acute cholecystitis due to intractable pain with a + murphy's sign on [place].  She was taken to the OR and underwent a laparoscopic cholecystectomy, and post operatively was having nausea and itching that prevented her from going home.  She is better this Am.  She ate breakfast and feels better. No further itching.   Consults: None  Significant Diagnostic Studies: [place]- stones, + murphy sign   Treatments: Laparoscopic cholecystectomy 08/13/17  Discharge Exam: Blood pressure 109/62, pulse 62, temperature 98.2 F (36.8 C), temperature source Oral, resp. rate 16, height 5\' 7"  (1.702 m), weight 237 lb 10.5 oz (107.8 kg), SpO2 94 %. General appearance: alert, cooperative and no distress Resp: normal work breathing GI: soft, appropriately tender, port sites c/d/i with dermabond, no erythema or drainage  Disposition: Discharge disposition: 01-Home or Self Care       Discharge Instructions    Call MD for:  difficulty breathing, headache or visual disturbances   Complete by:  As directed    Call MD for:  persistant dizziness or light-headedness   Complete by:  As directed    Call MD for:  persistant nausea and vomiting   Complete by:  As directed    Call MD for:  redness, tenderness, or signs of infection (pain, swelling, redness, odor or green/yellow discharge around incision site)   Complete by:  As directed    Call MD for:  severe uncontrolled pain   Complete by:  As directed    Call MD for:  temperature >100.4   Complete by:  As directed    Diet - low sodium heart healthy   Complete by:  As directed    Increase activity  slowly   Complete by:  As directed      Allergies as of 08/14/2017      Reactions   Sulfa Antibiotics Hives   Amoxicillin    Diclofenac    Makes pt sleepy   Penicillins Rash   .Has patient had a PCN reaction causing immediate rash, facial/tongue/throat swelling, SOB or lightheadedness with hypotension: Yes Has patient had a PCN reaction causing severe rash involving mucus membranes or skin necrosis: No Has patient had a PCN reaction that required hospitalization: Unknown Has patient had a PCN reaction occurring within the last 10 years: No If all of the above answers are "NO", then may proceed with Cephalosporin use.      Medication List    TAKE these medications   CLEAR EYES FOR DRY EYES 1-0.25 % Soln Generic drug:  Carboxymethylcellul-Glycerin Apply 1 drop to eye daily as needed.   diclofenac sodium 1 % Gel Commonly known as:  VOLTAREN Apply topically 4 (four) times daily.   docusate sodium 100 MG capsule Commonly known as:  COLACE Take 1 capsule (100 mg total) by mouth 2 (two) times daily.   estradiol 0.1 MG/24HR patch Commonly known as:  VIVELLE-DOT Place 1 patch onto the skin 2 (two) times a week.   levothyroxine 50 MCG tablet Commonly known as:  SYNTHROID, LEVOTHROID Take 50 mcg by mouth daily.   loratadine 10 MG tablet Commonly known as:  CLARITIN Take 10 mg by mouth daily.  multivitamin tablet Take 1 tablet by mouth daily.   NON FORMULARY [facility]  Achilles Tendonitis Cream- Diclofenac 3%, Baclofen 2%, Bupivacaine 1%, Doxepin 5%, Gabapentin 6%, Ibuprofen 3%, Pentoxifylline 3% Apply 1-2 grams to affected area 3-4 times daily Qty. 120 gm 3 refills   oxyCODONE 5 MG immediate release tablet Commonly known as:  ROXICODONE Take 1 tablet (5 mg total) by mouth every 4 (four) hours as needed for severe pain or breakthrough pain.   pantoprazole 40 MG tablet Commonly known as:  PROTONIX Take 40 mg by mouth daily.   Vitamin D-3 5000 units Tabs Take  1 capsule by mouth daily.   zolpidem 10 MG tablet Commonly known as:  AMBIEN Take 1 tablet by mouth at bedtime.      Follow-up Information    Virl Cagey, MD Follow up in 2 week(s).   Specialty:  General Surgery Contact information: [address] [place] [address] [phone]           Signed: Virl Cagey 08/14/2017, 10:56 AM

## 2017-08-16 ENCOUNTER — Encounter (HOSPITAL_COMMUNITY): Payer: Self-pay | Admitting: General Surgery

## 2017-08-26 ENCOUNTER — Ambulatory Visit (INDEPENDENT_AMBULATORY_CARE_PROVIDER_SITE_OTHER): Payer: Self-pay | Admitting: General Surgery

## 2017-08-26 ENCOUNTER — Encounter: Payer: Self-pay | Admitting: General Surgery

## 2017-08-26 VITALS — BP 116/80 | HR 80 | Temp 98.6°F | Resp 18 | Wt 227.0 lb

## 2017-08-26 DIAGNOSIS — K810 Acute cholecystitis: Secondary | ICD-10-CM

## 2017-08-26 NOTE — Progress Notes (Signed)
[facility] Note   HPI:  52 y.o. Female presents to clinic for post-op follow-up evaluation of after a laparoscopic cholecystectomy. Patient reports she is doing well. Eating and minimal pain.   Review of Systems:  No fevers or chills Eating well All other review of systems: otherwise negative   Pathology: Diagnosis Gallbladder - CHRONIC CHOLECYSTITIS. - CHOLESTEROLOSIS.  Vital Signs:  BP 116/80 (BP Location: Left Arm, Patient Position: Sitting, Cuff Size: Normal)   Pulse 80   Temp 98.6 F (37 C) (Temporal)   Resp 18   Wt 227 lb (103 kg)   BMI 35.55 kg/m    Physical Exam:  Physical Exam  Constitutional: She appears well-developed.  HENT:  Head: Normocephalic.  Cardiovascular: Normal rate.  Pulmonary/Chest: Effort normal.  Abdominal: Soft. She exhibits no distension. There is no tenderness.  Port sites healed, no erythema or drainage  Vitals reviewed.   Laboratory studies: None  Imaging:  None    Assessment:  52 y.o. yo Female s/p laparoscopic cholecystectomy for cholecystitis.   Plan:  - Diet as tolerated - Follow up PRN    All of the above recommendations were discussed with the patient, and all of patient's questions were answered to her expressed satisfaction.  Curlene Labrum, MD [facility] [address] [place], [place] [address] [phone] (office)

## 2018-10-31 ENCOUNTER — Other Ambulatory Visit: Payer: Self-pay

## 2018-10-31 DIAGNOSIS — Z20822 Contact with and (suspected) exposure to covid-19: Secondary | ICD-10-CM

## 2018-11-01 LAB — NOVEL CORONAVIRUS, NAA: SARS-CoV-2, NAA: NOT DETECTED

## 2018-12-08 ENCOUNTER — Other Ambulatory Visit: Payer: Self-pay | Admitting: *Deleted

## 2018-12-08 DIAGNOSIS — Z20822 Contact with and (suspected) exposure to covid-19: Secondary | ICD-10-CM

## 2018-12-09 LAB — NOVEL CORONAVIRUS, NAA: SARS-CoV-2, NAA: NOT DETECTED

## 2019-01-03 DIAGNOSIS — C439 Malignant melanoma of skin, unspecified: Secondary | ICD-10-CM | POA: Insufficient documentation

## 2019-01-18 IMAGING — CR DG CHEST 1V PORT
1 series · 1 of 1 positions shown · non-contrast
Comparison: None.

CLINICAL DATA: Preoperative evaluation for cholecystectomy.

EXAM:
PORTABLE CHEST 1 VIEW

[portable]
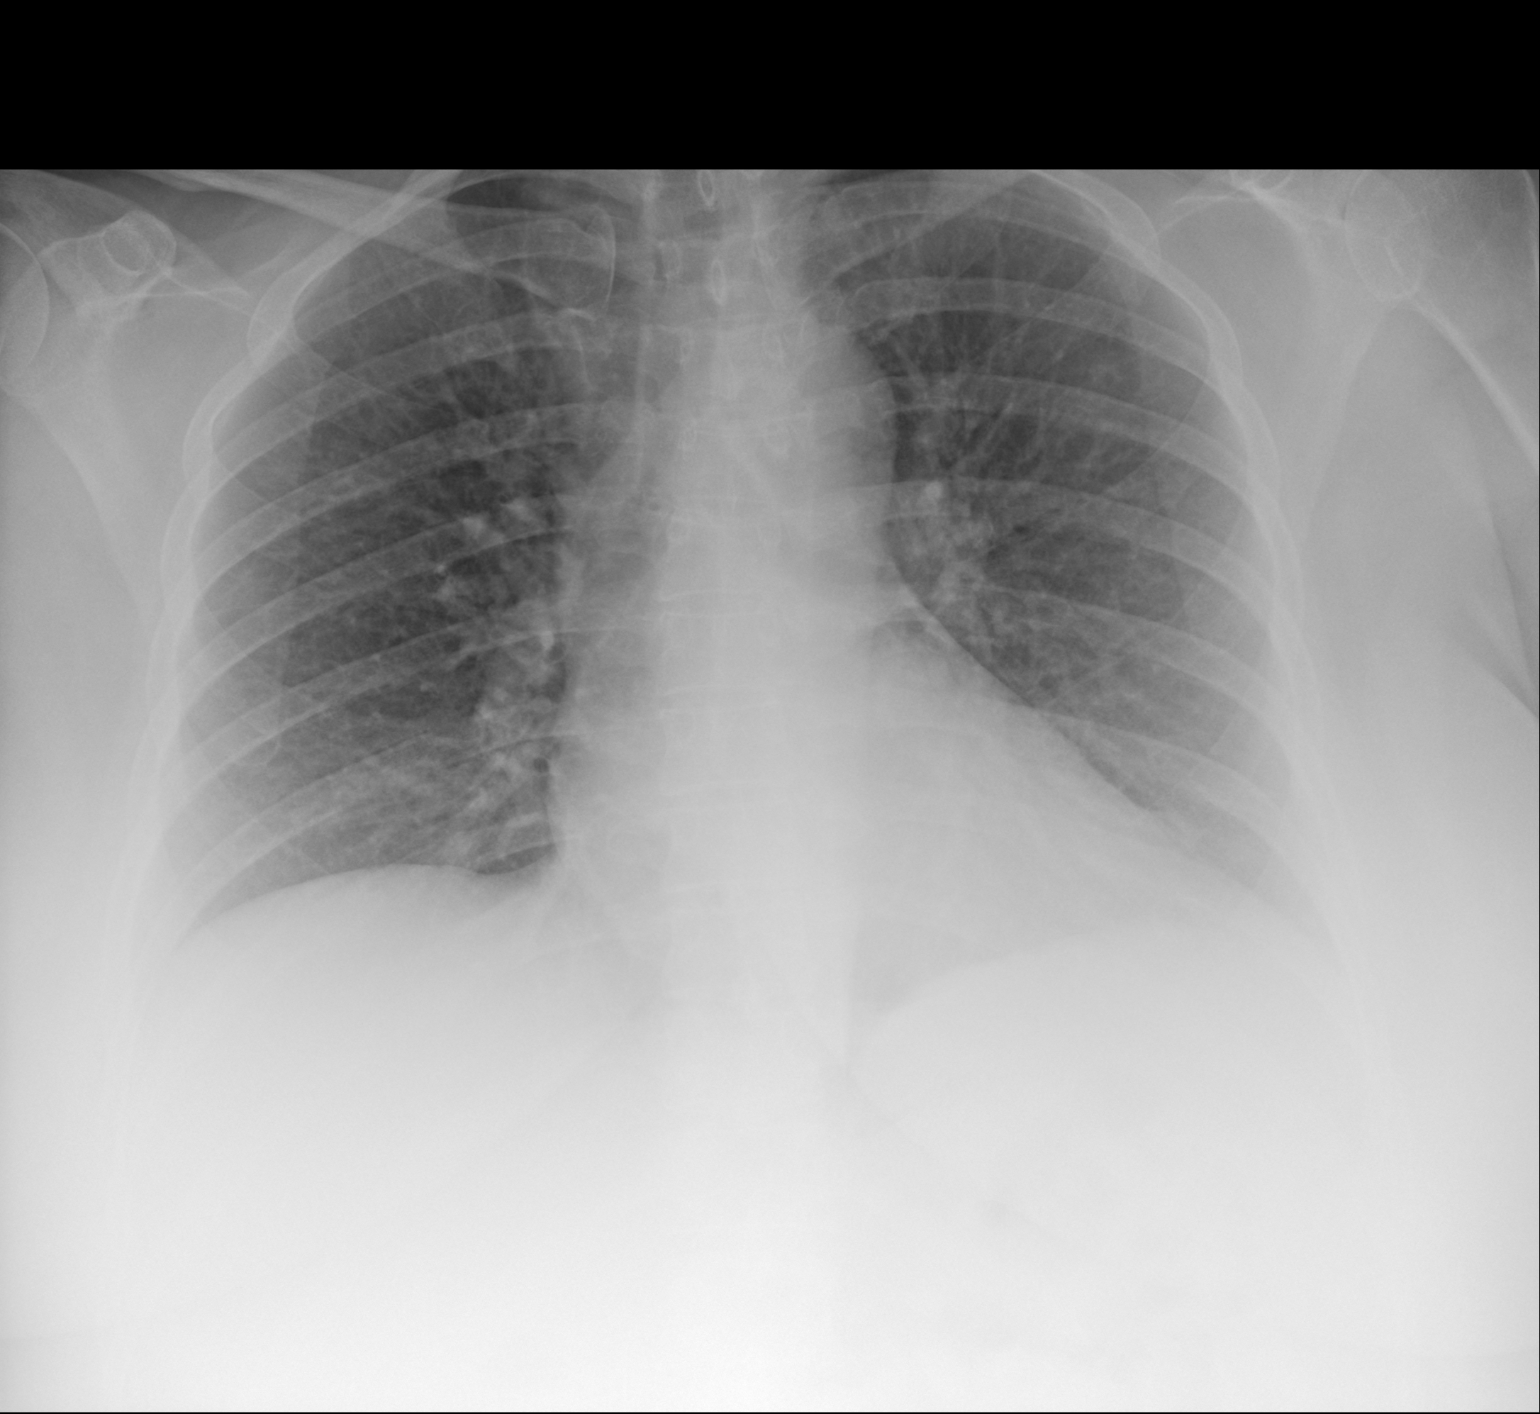

[1 of 1 positions shown; findings below may reference images not displayed]

FINDINGS: Lungs are clear. Heart size and pulmonary vascularity are normal. No
adenopathy. No bone lesions.
IMPRESSION: No edema or consolidation.

## 2019-03-19 ENCOUNTER — Telehealth: Payer: 59 | Admitting: Family

## 2019-03-19 DIAGNOSIS — Z20822 Contact with and (suspected) exposure to covid-19: Secondary | ICD-10-CM

## 2019-03-19 MED ORDER — BENZONATATE 100 MG PO CAPS
100.0000 mg | ORAL_CAPSULE | Freq: Three times a day (TID) | ORAL | 0 refills | Status: DC | PRN
Start: 2019-03-19 — End: 2020-11-18

## 2019-03-19 NOTE — Progress Notes (Signed)
E-Visit for Corona Virus Screening   Your current symptoms could be consistent with the coronavirus.  Many health care providers can now test patients at their office but not all are.  [facility] has multiple testing sites. For information on our [place] testing locations and hours go to [email or web address]  We are enrolling you in our [place] for [place] . Daily you will receive a questionnaire within the [place] website. Our COVID 19 response team will be monitoring your responses daily.  Testing Information: The COVID-19 Community Testing sites will begin testing BY APPOINTMENT ONLY.  You can schedule online at [email or web address]  If you do not have access to a smart phone or computer you may call [phone] for an appointment.  Testing Locations: Appointment schedule is 8 am to 3:30 pm at all sites  [facility] indoors at [address] [place] [address] [facility]  indoors at [place]. [address] [place], [place] [address] [place] indoors at [address], [place] [address]  Additional testing sites in the Community:  . For [facility] Testing sites in [facility]  FaceUpdate.uy  . For Pop-up testing sites in [place]  BowlDirectory.co.uk  . For Testing sites with regular hours https://onsms.org/[place]/  . For [place] RenewablesAnalytics.si  . For [facility] BasicJet.ca  . For [facility] testing in [place] and [employer] BasicJet.[email or web address]  . For [facility] testing in [facility]   https://lhi.care/covidtesting  For  more  information about community testing call [phone]   We are enrolling you in our [place] for [place] . Daily you will receive a questionnaire within the [place] website. Our COVID 19 response team will be monitoring your responses daily.  Please quarantine yourself while awaiting your test results. If you develop fever/cough/breathlessness, please stay home for 10 days with improving symptoms and until you have had 24 hours of no fever (without taking a fever reducer).  You should wear a mask or cloth face covering over your nose and mouth if you must be around other people or animals, including pets (even at home). Try to stay at least 6 feet away from other people. This will protect the people around you.  Please continue good preventive care measures, including:  frequent hand-washing, avoid touching your face, cover coughs/sneezes, stay out of crowds and keep a 6 foot distance from others.  COVID-19 is a respiratory illness with symptoms that are similar to the flu. Symptoms are typically mild to moderate, but there have been cases of severe illness and death due to the virus.   The following symptoms may appear 2-14 days after exposure: . Fever . Cough . Shortness of breath or difficulty breathing . Chills . Repeated shaking with chills . Muscle pain . Headache . Sore throat . New loss of taste or smell . Fatigue . Congestion or runny nose . Nausea or vomiting . Diarrhea  Go to the nearest hospital ED for assessment if fever/cough/breathlessness are severe or illness seems like a threat to life.  It is vitally important that if you feel that you have an infection such as this virus or any other virus that you stay home and away from places where you may spread it to others.  You should avoid contact with people age 55 and older.   You can use medication such as A prescription cough medication called Tessalon Perles 100 mg. You may take 1-2 capsules every 8 hours as  needed for cough.  You may also take acetaminophen (Tylenol) as needed for fever.  Reduce your risk of any infection by using the same precautions used for avoiding the common cold or flu:  [place] Kitchen Wash your hands often with soap and warm water for at least 20 seconds.  If soap and water are not readily available, use an alcohol-based hand sanitizer with at least 60% alcohol.  . If coughing or sneezing, cover your mouth and nose by coughing or sneezing into the elbow areas of your shirt or coat, into a tissue or into your sleeve (not your hands). . Avoid shaking hands with others and consider head nods or verbal greetings only. . Avoid touching your eyes, nose, or mouth with unwashed hands.  . Avoid close contact with people who are sick. . Avoid places or events with large numbers of people in one location, like concerts or sporting events. . Carefully consider travel plans you have or are making. . If you are planning any travel outside or inside the [place], visit the CDC's Travelers' Health webpage for the latest health notices. . If you have some symptoms but not all symptoms, continue to monitor at home and seek medical attention if your symptoms worsen. . If you are having a medical emergency, call 911.  HOME CARE . Only take medications as instructed by your medical team. . Drink plenty of fluids and get plenty of rest. . A steam or ultrasonic humidifier can help if you have congestion.   GET HELP RIGHT AWAY IF YOU HAVE EMERGENCY WARNING SIGNS** FOR COVID-19. If you or someone is showing any of these signs seek emergency medical care immediately. Call 911 or proceed to your closest emergency facility if: . You develop worsening high fever. . Trouble breathing . Bluish lips or face . Persistent pain or pressure in the chest . New confusion . Inability to wake or stay awake . You cough up blood. . Your symptoms become more severe  **This list is not all possible symptoms. Contact your  medical provider for any symptoms that are sever or concerning to you.  MAKE SURE YOU   Understand these instructions.  Will watch your condition.  Will get help right away if you are not doing well or get worse.  Your e-visit answers were reviewed by a board certified advanced clinical practitioner to complete your personal care plan.  Depending on the condition, your plan could have included both over the counter or prescription medications.  If there is a problem please reply once you have received a response from your provider.  Your safety is important to [place].  If you have drug allergies check your prescription carefully.    You can use MyChart to ask questions about today's visit, request a non-urgent call back, or ask for a work or school excuse for 24 hours related to this e-Visit. If it has been greater than 24 hours you will need to follow up with your provider, or enter a new e-Visit to address those concerns. You will get an e-mail in the next two days asking about your experience.  I hope that your e-visit has been valuable and will speed your recovery. Thank you for using e-visits.  Approximately 5 minutes was spent documenting and reviewing patient's chart.

## 2019-03-21 ENCOUNTER — Encounter (INDEPENDENT_AMBULATORY_CARE_PROVIDER_SITE_OTHER): Payer: Self-pay

## 2019-03-21 ENCOUNTER — Ambulatory Visit: Payer: 59 | Attending: Internal Medicine

## 2019-03-21 ENCOUNTER — Other Ambulatory Visit: Payer: Self-pay

## 2019-03-21 DIAGNOSIS — Z20822 Contact with and (suspected) exposure to covid-19: Secondary | ICD-10-CM

## 2019-03-22 ENCOUNTER — Encounter (INDEPENDENT_AMBULATORY_CARE_PROVIDER_SITE_OTHER): Payer: Self-pay

## 2019-03-22 LAB — NOVEL CORONAVIRUS, NAA: SARS-CoV-2, NAA: DETECTED — AB

## 2019-03-23 ENCOUNTER — Telehealth: Payer: Self-pay

## 2019-03-23 ENCOUNTER — Encounter (INDEPENDENT_AMBULATORY_CARE_PROVIDER_SITE_OTHER): Payer: Self-pay

## 2019-03-23 NOTE — Telephone Encounter (Signed)
Patient states that she is having diarrhea and weakness. Patient advise on protocol as follows:   IF PATIENT HAS WORSENING WEAKNESS WITH INABILITY TO STAND OR IF PATIENT HAS TO HOLD ON TO SOMETHING TO GET BALANCE, ADVISE PATIENT TO CALL 911 AND SEEK TREATMENT IN ED    If diarrhea remains the same: encourage patient to drink oral fluids and bland foods.   Avoid alcohol, spicy foods, caffeine or fatty foods that could make diarrhea worse.   Continue to monitor for signs of dehydration (increased thirst decreased urine output, yellow urine, dry skin, headache or dizziness).   Advise patient to try OTC medication (Imodium, kaopectate, Pepto-Bismol) as per manufacturer's instructions.    If worsening diarrhea occurs and becomes severe (6-7 bowel movements a day): notify PCP   If diarrhea last greater than 7 days: notify PCP   IF SIGNS OF DEHYDRATION OCCUR (INCREASED THIRST, DECREASED URINE OUTPUT, YELLOW URINE, DRY SKIN, HEADACHE OR DIZZINESS) ADVISE PATIENT TO CALL 911 AND SEEK TREATMENT IN THE ED.  Patient will continue to monitor

## 2019-03-24 ENCOUNTER — Telehealth: Payer: Self-pay | Admitting: General Practice

## 2019-03-24 ENCOUNTER — Encounter (INDEPENDENT_AMBULATORY_CARE_PROVIDER_SITE_OTHER): Payer: Self-pay

## 2019-03-24 NOTE — Telephone Encounter (Signed)
Pt called in to request to have results fax to her boss at : [phone] Attn: Herma Carson     Sent request to have faxed.

## 2019-03-25 ENCOUNTER — Encounter (INDEPENDENT_AMBULATORY_CARE_PROVIDER_SITE_OTHER): Payer: Self-pay

## 2019-03-26 ENCOUNTER — Encounter (INDEPENDENT_AMBULATORY_CARE_PROVIDER_SITE_OTHER): Payer: Self-pay

## 2019-03-27 ENCOUNTER — Encounter (INDEPENDENT_AMBULATORY_CARE_PROVIDER_SITE_OTHER): Payer: Self-pay

## 2019-03-29 ENCOUNTER — Encounter (INDEPENDENT_AMBULATORY_CARE_PROVIDER_SITE_OTHER): Payer: Self-pay

## 2019-03-29 ENCOUNTER — Telehealth: Payer: Self-pay | Admitting: *Deleted

## 2019-03-29 NOTE — Telephone Encounter (Signed)
Per pt. Request, faxed letter to Employer explaining recommendation to continue self isolation x 5 additional days, due to a continued dry cough.  (See letter in chart.) Faxed informational letter to Employer, AttnAlvy Beal @ [phone]

## 2019-03-29 NOTE — Telephone Encounter (Signed)
Responding to [employer] genrated by Patient questionnaire. Reporting she continues to have a dry cough. Denies SOB/Chest tightness. Patient reported she went to work today and due to the cough was sent home. Discussed continuing her isolation until January 24' 21 due to no improvement in her cough. Care Advice including increase daily water intake/OTC cough suppressant/humidifier. Call your primary care doctor or seek treatment at Urgent Care with a mask on. On 04/02/19 you can discontinue isolation if you have no fever and not taking fever reducing medication and your other symptoms are improving including the cough and any other respiratory symptoms.

## 2019-03-30 ENCOUNTER — Encounter (INDEPENDENT_AMBULATORY_CARE_PROVIDER_SITE_OTHER): Payer: Self-pay

## 2019-03-31 ENCOUNTER — Encounter (INDEPENDENT_AMBULATORY_CARE_PROVIDER_SITE_OTHER): Payer: Self-pay

## 2019-04-01 ENCOUNTER — Encounter (INDEPENDENT_AMBULATORY_CARE_PROVIDER_SITE_OTHER): Payer: Self-pay

## 2019-06-27 ENCOUNTER — Ambulatory Visit: Payer: 59 | Admitting: Family Medicine

## 2020-02-07 ENCOUNTER — Other Ambulatory Visit: Payer: Self-pay

## 2020-02-07 ENCOUNTER — Ambulatory Visit (INDEPENDENT_AMBULATORY_CARE_PROVIDER_SITE_OTHER): Payer: No Typology Code available for payment source | Admitting: Podiatry

## 2020-02-07 DIAGNOSIS — L600 Ingrowing nail: Secondary | ICD-10-CM | POA: Diagnosis not present

## 2020-02-07 MED ORDER — GENTAMICIN SULFATE 0.1 % EX CREA: 1.0000 "application " | TOPICAL_CREAM | Freq: Two times a day (BID) | CUTANEOUS | 1 refills | Status: AC

## 2020-02-07 NOTE — Patient Instructions (Signed)

## 2020-02-07 NOTE — Progress Notes (Signed)
   Subjective: Patient presents today for evaluation of pain to the lateral border right great toe. Patient is concerned for possible ingrown nail. Patient presents today for further treatment and evaluation.  Past Medical History:  Diagnosis Date  . Atypical migraine   . Cancer (HCC)    Skin  . Hypothyroidism   . PVC's (premature ventricular contractions)   . Vertigo     Objective:  General: Well developed, nourished, in no acute distress, alert and oriented x3   Dermatology: Skin is warm, dry and supple bilateral.  Lateral border right great toe appears to be erythematous with evidence of an ingrowing nail. Pain on palpation noted to the border of the nail fold. The remaining nails appear unremarkable at this time. There are no open sores, lesions.  Vascular: Dorsalis Pedis artery and Posterior Tibial artery pedal pulses palpable. No lower extremity edema noted.   Neruologic: Grossly intact via light touch bilateral.  Musculoskeletal: Muscular strength within normal limits in all groups bilateral. Normal range of motion noted to all pedal and ankle joints.   Assesement: #1 Paronychia with ingrowing nail lateral border right great toe #2 Pain in toe #3 Incurvated nail  Plan of Care:  1. Patient evaluated.  2. Discussed treatment alternatives and plan of care. Explained nail avulsion procedure and post procedure course to patient. 3. Patient opted for permanent partial nail avulsion of the lateral border right great toe.  4. Prior to procedure, local anesthesia infiltration utilized using 3 ml of a 50:50 mixture of 2% plain lidocaine and 0.5% plain marcaine in a normal hallux block fashion and a betadine prep performed.  5. Partial permanent nail avulsion with chemical matrixectomy performed using [id number] applications of phenol followed by alcohol flush.  6. Light dressing applied. 7.  Prescription for gentamicin cream applied daily  8.  Return to clinic 2 weeks  *[place].  First shift  Edrick Kins, DPM [facility]  Dr. Edrick Kins, [address]                                        [place], [place] [address]                Office [phone]  Fax [phone]

## 2020-02-19 ENCOUNTER — Ambulatory Visit (INDEPENDENT_AMBULATORY_CARE_PROVIDER_SITE_OTHER): Payer: No Typology Code available for payment source | Admitting: Podiatry

## 2020-02-19 ENCOUNTER — Other Ambulatory Visit: Payer: Self-pay

## 2020-02-19 DIAGNOSIS — L600 Ingrowing nail: Secondary | ICD-10-CM | POA: Diagnosis not present

## 2020-02-19 MED ORDER — DOXYCYCLINE HYCLATE 100 MG PO TABS: 100.0000 mg | ORAL_TABLET | Freq: Two times a day (BID) | ORAL | 0 refills | Status: AC

## 2020-02-25 NOTE — Progress Notes (Signed)
   Subjective: 54 y.o. female presents today status post permanent nail avulsion procedure of the lateral border right great toe that was performed on 02/07/2020.  Patient states she still has some tenderness associated to the area.  She has been applying the antibiotic gentamicin cream as instructed.  She presents for further treatment and evaluation  Past Medical History:  Diagnosis Date  . Atypical migraine   . Cancer (HCC)    Skin  . Hypothyroidism   . PVC's (premature ventricular contractions)   . Vertigo     Objective: Skin is warm, dry and supple. Nail and respective nail fold appears to be healing appropriately. Open wound to the associated nail fold with a granular wound base and moderate amount of fibrotic tissue. Minimal drainage noted. Mild erythema around the periungual region likely due to phenol chemical matricectomy.  Assessment: #1 postop permanent partial nail avulsion lateral border right hallux #2 open wound periungual nail fold of respective digit.   Plan of care: #1 patient was evaluated  #2 debridement of open wound was performed to the periungual border of the respective toe using a currette. Antibiotic ointment and Band-Aid was applied. #3  Prescription for doxycycline 100 mg 2 times daily #20  #4 patient is to return to clinic on a PRN basis.   Edrick Kins, DPM [facility]  Dr. Edrick Kins, DPM    [address], [place] [address]                Office [phone]  Fax [phone]

## 2020-04-04 ENCOUNTER — Ambulatory Visit (INDEPENDENT_AMBULATORY_CARE_PROVIDER_SITE_OTHER): Payer: No Typology Code available for payment source

## 2020-04-04 ENCOUNTER — Ambulatory Visit (INDEPENDENT_AMBULATORY_CARE_PROVIDER_SITE_OTHER): Payer: No Typology Code available for payment source | Admitting: Podiatry

## 2020-04-04 ENCOUNTER — Other Ambulatory Visit: Payer: Self-pay

## 2020-04-04 DIAGNOSIS — M79672 Pain in left foot: Secondary | ICD-10-CM

## 2020-04-04 DIAGNOSIS — L600 Ingrowing nail: Secondary | ICD-10-CM | POA: Diagnosis not present

## 2020-04-04 DIAGNOSIS — M79671 Pain in right foot: Secondary | ICD-10-CM

## 2020-04-04 DIAGNOSIS — M722 Plantar fascial fibromatosis: Secondary | ICD-10-CM

## 2020-04-04 MED ORDER — AZITHROMYCIN 250 MG PO TABS: ORAL_TABLET | ORAL | 0 refills | Status: DC

## 2020-04-04 MED ORDER — MELOXICAM 15 MG PO TABS: 15.0000 mg | ORAL_TABLET | Freq: Every day | ORAL | 2 refills | Status: AC

## 2020-04-04 NOTE — Patient Instructions (Signed)

## 2020-04-05 ENCOUNTER — Telehealth: Payer: Self-pay | Admitting: *Deleted

## 2020-04-05 NOTE — Telephone Encounter (Addendum)
Patient is calling for status of medication that was supposed to be sent to pharmacy after visit. Please advise.  Called patient and she has picked up both prescriptions.

## 2020-04-05 NOTE — Progress Notes (Signed)
Subjective:   Patient ID: Kristine Melton, female   DOB: 55 y.o.   MRN: [id number]   HPI Patient presents with some concerns about her right big toenail that have been fixed several months ago and extreme discomfort plantar aspect left over right heel with inflammation fluid of the medial band   ROS      Objective:  Physical Exam  Neurovascular status intact with a slight redness of the right hallux medial side that is localized with no proximal edema erythema drainage noted and exquisite discomfort plantar fascial left over right heel with fluid buildup at the insertional point calcaneus     Assessment:  Nail left which appears to be healing and may have a very slight localized reaction with acute plantar fasciitis left painful when palpated     Plan:  H&P reviewed conditions with patient.  At this point for the left I did sterile prep and injected the left fascial insertion 3 mg dexamethasone Kenalog 5 mg Xylocaine and applied fascial brace to lift the arch along with physical therapy and oral anti-inflammatories diclofenac 75 mg twice daily.  For the right I went ahead and discussed soaking and bandage usage during the day and we will not treat the heel currently.  Patient is encouraged to call with questions concerns  X-rays indicate spur formation left over right heel moderate depression of the arch no indication stress fracture arthritis

## 2020-04-08 NOTE — Telephone Encounter (Signed)
That is over the counter

## 2020-06-06 ENCOUNTER — Other Ambulatory Visit: Payer: Self-pay

## 2020-06-06 ENCOUNTER — Ambulatory Visit (INDEPENDENT_AMBULATORY_CARE_PROVIDER_SITE_OTHER): Payer: No Typology Code available for payment source | Admitting: Podiatry

## 2020-06-06 DIAGNOSIS — M722 Plantar fascial fibromatosis: Secondary | ICD-10-CM | POA: Diagnosis not present

## 2020-06-06 DIAGNOSIS — L600 Ingrowing nail: Secondary | ICD-10-CM

## 2020-06-06 MED ORDER — TRIAMCINOLONE ACETONIDE 10 MG/ML IJ SUSP
10.0000 mg | Freq: Once | INTRAMUSCULAR | Status: AC
  Administered 2020-06-06: 10 mg

## 2020-06-06 NOTE — Progress Notes (Signed)
Subjective:   Patient ID: Kristine Melton, female   DOB: 54 y.o.   MRN: [id number]   HPI Patient states she is developing more discomfort in the plantar aspect of the left heel and also on the right states the nail seems to be bothering her she wants it checked   ROS      Objective:  Physical Exam  Neurovascular status found to be intact muscle strength is adequate patient found to have exquisite discomfort plantar left heel at the insertional point tendon calcaneus and the right nail becomes incurvated along with the left lateral border that she is not sure about     Assessment:  Acute plantar fasciitis left along with nail disease ingrown toenail condition      Plan:  H&P reviewed condition of focusing on the heel and I did do sterile prep and injected the plantar fascia 3 mg Kenalog 5 mg Xylocaine advised on support shoes brace usage and night splint.  For nail I reck recommended cushioning and soaks and this was accomplished today

## 2020-08-14 ENCOUNTER — Encounter: Payer: Self-pay | Admitting: Podiatry

## 2020-08-14 ENCOUNTER — Ambulatory Visit (INDEPENDENT_AMBULATORY_CARE_PROVIDER_SITE_OTHER): Payer: No Typology Code available for payment source | Admitting: Podiatry

## 2020-08-14 ENCOUNTER — Other Ambulatory Visit: Payer: Self-pay

## 2020-08-14 DIAGNOSIS — M722 Plantar fascial fibromatosis: Secondary | ICD-10-CM

## 2020-08-14 NOTE — Progress Notes (Signed)
Subjective:   Patient ID: Kristine Melton, female   DOB: 55 y.o.   MRN: [id number]   HPI Patient presents stating her left heel has been very tender and has not responded conservatively to the treatments rendered   ROS      Objective:  Physical Exam  Neurovascular status intact with patient's left heel found to be intensely discomforting in the plantar portion medial side with inflammation fluid buildup     Assessment:  Acute Planter fasciitis left failure to respond so Far conservatively     Plan:  H&P reviewed condition and recommended shockwave therapy and patient is scheduled for shockwave therapy.  Patient is to use air fracture walker until then to try to calm it down and is encouraged to use ice at the current time.  Reappoint after having shockwave therapy and may require open surgery at 1 point in the future

## 2020-08-19 ENCOUNTER — Other Ambulatory Visit: Payer: No Typology Code available for payment source

## 2020-08-19 ENCOUNTER — Ambulatory Visit: Payer: 59 | Admitting: Podiatry

## 2020-08-19 ENCOUNTER — Ambulatory Visit (INDEPENDENT_AMBULATORY_CARE_PROVIDER_SITE_OTHER): Payer: No Typology Code available for payment source | Admitting: *Deleted

## 2020-08-19 ENCOUNTER — Other Ambulatory Visit: Payer: Self-pay

## 2020-08-19 DIAGNOSIS — M722 Plantar fascial fibromatosis: Secondary | ICD-10-CM

## 2020-08-19 NOTE — Patient Instructions (Signed)

## 2020-08-19 NOTE — Progress Notes (Deleted)
Patient presents for the 1st EPAT treatment today with complaint of plantar heel pain left. Diagnosed with plantar fasciitis by Dr. Paulla Dolly. This has been ongoing for several months. The patient has tried ice, stretching, NSAIDS and supportive shoe gear with no long term relief.   Most of the pain is located *** .  ESWT administered and tolerated well.Treatment settings initiated at:   Energy:  Ended treatment session today with 3000 shocks at the following settings:   Energy:  Frequency:  Joules:   Reviewed post EPAT instructions. Advised to avoid ice and NSAIDs throughout the treatment process and to utilize boot or supportive shoes for at least the next 3 days.  Follow up for *** treatment in 1 week.

## 2020-08-19 NOTE — Progress Notes (Signed)
Patient presents today for her first EPAT treatment, however, the machine was not functioning properly and she needed to be cancelled. She will be called to reschedule once the machine is up and running.   Per Keely-comp her one treatment due to inconvenience.

## 2020-10-14 ENCOUNTER — Ambulatory Visit (INDEPENDENT_AMBULATORY_CARE_PROVIDER_SITE_OTHER): Payer: 59 | Admitting: *Deleted

## 2020-10-14 ENCOUNTER — Encounter: Payer: Self-pay | Admitting: Podiatry

## 2020-10-14 DIAGNOSIS — M722 Plantar fascial fibromatosis: Secondary | ICD-10-CM | POA: Diagnosis not present

## 2020-10-14 NOTE — Patient Instructions (Signed)

## 2020-10-14 NOTE — Progress Notes (Signed)
Patient presents for the 1st EPAT treatment today with complaint of plantar heel pain left. Diagnosed with plantar fasciitis by Dr. Paulla Dolly. This has been ongoing for several months. The patient has tried ice, stretching, NSAIDS and supportive shoe gear with no long term relief.   Most of the pain is located plantar central heel and arch.  ESWT administered and tolerated well.Treatment settings initiated at:   Energy: 15  Ended treatment session today with 3000 shocks at the following settings:   Energy: 15  Frequency: 6.0  Joules: 14.72   Reviewed post EPAT instructions. Advised to avoid ice and NSAIDs throughout the treatment process and to utilize boot or supportive shoes for at least the next 3 days.  Follow up for 2nd treatment in 1 week.  Patient was a dispensed a CAM boot for offloading today.

## 2020-10-28 ENCOUNTER — Other Ambulatory Visit: Payer: Self-pay

## 2020-10-28 ENCOUNTER — Ambulatory Visit (INDEPENDENT_AMBULATORY_CARE_PROVIDER_SITE_OTHER): Payer: 59

## 2020-10-28 DIAGNOSIS — B351 Tinea unguium: Secondary | ICD-10-CM

## 2020-10-28 DIAGNOSIS — M722 Plantar fascial fibromatosis: Secondary | ICD-10-CM

## 2020-10-28 NOTE — Progress Notes (Signed)
Patient presents for the 2nd EPAT treatment today with complaint of plantar heel pain left. Diagnosed with plantar fasciitis by Dr. Paulla Dolly. This has been ongoing for several months. The patient has tried ice, stretching, NSAIDS and supportive shoe gear with no long term relief.   Most of the pain is located plantar central heel and arch.  ESWT administered and tolerated well.Treatment settings initiated at:   Energy: 20  Ended treatment session today with 3000 shocks at the following settings:   Energy: 20  Frequency: 5.0  Joules: 19.62   Reviewed post EPAT instructions. Advised to avoid ice and NSAIDs throughout the treatment process and to utilize boot or supportive shoes for at least the next 3 days.  Follow up for 3rd treatment in 1 week.  Patient was a dispensed a CAM boot for offloading today.

## 2020-11-04 ENCOUNTER — Ambulatory Visit (INDEPENDENT_AMBULATORY_CARE_PROVIDER_SITE_OTHER): Payer: 59

## 2020-11-04 ENCOUNTER — Other Ambulatory Visit: Payer: Self-pay

## 2020-11-04 DIAGNOSIS — B351 Tinea unguium: Secondary | ICD-10-CM

## 2020-11-04 DIAGNOSIS — M722 Plantar fascial fibromatosis: Secondary | ICD-10-CM

## 2020-11-04 NOTE — Progress Notes (Signed)
Patient presents for the 3rd EPAT treatment today with complaint of plantar heel pain left. Diagnosed with plantar fasciitis by Dr. Paulla Dolly. This has been ongoing for several months. The patient has tried ice, stretching, NSAIDS and supportive shoe gear with no long term relief.   Most of the pain is located plantar central heel and arch.  ESWT administered and tolerated well.Treatment settings initiated at:   Energy: 25  Ended treatment session today with 3000 shocks at the following settings:   Energy: 25  Frequency: 4.0  Joules: 24.52   Reviewed post EPAT instructions. Advised to avoid ice and NSAIDs throughout the treatment process and to utilize boot or supportive shoes for at least the next 3 days.  Follow up for 4th  treatment in 2 weeks.

## 2020-11-18 ENCOUNTER — Other Ambulatory Visit: Payer: Self-pay

## 2020-11-18 ENCOUNTER — Ambulatory Visit (INDEPENDENT_AMBULATORY_CARE_PROVIDER_SITE_OTHER): Payer: Self-pay | Admitting: *Deleted

## 2020-11-18 DIAGNOSIS — M722 Plantar fascial fibromatosis: Secondary | ICD-10-CM

## 2020-11-18 DIAGNOSIS — B351 Tinea unguium: Secondary | ICD-10-CM

## 2020-11-18 NOTE — Progress Notes (Signed)
Patient presents for the 4th EPAT treatment today with complaint of plantar heel pain left. Diagnosed with plantar fasciitis by Dr. Paulla Dolly. This has been ongoing for several months. The patient has tried ice, stretching, NSAIDS and supportive shoe gear with no long term relief.   Most of the pain today is located medial foot and lateral side. She says the foot is feeling so much better.  ESWT administered and tolerated well.Treatment settings initiated at:   Energy: 30  Ended treatment session today with 3000 shocks at the following settings:   Energy: 30  Frequency: 4.0  Joules: 29.72   Reviewed post EPAT instructions. Advised to avoid ice and NSAIDs until follow up visit with Dr. Paulla Dolly and to utilize boot or supportive shoes for at least the next 3 days.  Follow up with Dr. Paulla Dolly for re-evaluation in 4 weeks.

## 2021-07-04 ENCOUNTER — Emergency Department (HOSPITAL_BASED_OUTPATIENT_CLINIC_OR_DEPARTMENT_OTHER)
Admission: EM | Admit: 2021-07-04 | Discharge: 2021-07-04 | Disposition: A | Discharge status: Home / Self Care | Payer: No Typology Code available for payment source | Attending: Emergency Medicine | Admitting: Emergency Medicine

## 2021-07-04 ENCOUNTER — Encounter (HOSPITAL_BASED_OUTPATIENT_CLINIC_OR_DEPARTMENT_OTHER): Payer: Self-pay

## 2021-07-04 DIAGNOSIS — L299 Pruritus, unspecified: Secondary | ICD-10-CM | POA: Diagnosis not present

## 2021-07-04 DIAGNOSIS — T50905A Adverse effect of unspecified drugs, medicaments and biological substances, initial encounter: Secondary | ICD-10-CM

## 2021-07-04 DIAGNOSIS — Z79899 Other long term (current) drug therapy: Secondary | ICD-10-CM | POA: Insufficient documentation

## 2021-07-04 DIAGNOSIS — T467X5A Adverse effect of peripheral vasodilators, initial encounter: Secondary | ICD-10-CM | POA: Diagnosis not present

## 2021-07-04 DIAGNOSIS — T7840XA Allergy, unspecified, initial encounter: Secondary | ICD-10-CM | POA: Diagnosis present

## 2021-07-04 DIAGNOSIS — E039 Hypothyroidism, unspecified: Secondary | ICD-10-CM | POA: Diagnosis not present

## 2021-07-04 DIAGNOSIS — Z85828 Personal history of other malignant neoplasm of skin: Secondary | ICD-10-CM | POA: Diagnosis not present

## 2021-07-04 MED ORDER — FAMOTIDINE 20 MG PO TABS
20.0000 mg | ORAL_TABLET | Freq: Once | ORAL | Status: AC
  Administered 2021-07-04: 20 mg via ORAL
  Filled 2021-07-04: qty 1

## 2021-07-04 NOTE — Discharge Instructions (Addendum)
Please discontinue use of niacin ? ?Please continue taking antihistamine/Claritin daily ? ?It was a pleasure caring for you today in the emergency department. ? ?Please return to the emergency department for any worsening or worrisome symptoms. ? ?

## 2021-07-04 NOTE — ED Provider Notes (Signed)
?[facility] EMERGENCY DEPARTMENT ?Provider Note ? ? ?CSN: [id number] ?Arrival date & time: 07/04/21  1414 ? ?  ? ?History ? ?Chief Complaint  ?Patient presents with  ? Allergic Reaction  ? ? ?Kristine Melton is a 56 y.o. female. ? ? Patient as above with significant medical history as below, including migraine, hypothyroidism, PVCs, vertigo who presents to the ED with complaint of allergic reaction. ? ?Started taking niacin yesterday, today she took her second dose of niacin, proximately 40 minutes after taking niacin she began to have flushing diffusely of her skin, itching of the skin.  No respiratory issues, no tongue or lip swelling, no difficulty speaking or swallowing.  No urticaria, no nausea or vomiting.  Patient was given Benadryl and steroids by EMS.  She is currently at her baseline.  Symptoms have resolved in their entirety.  She has known allergies to some antibiotics including penicillin which is anaphylaxis.  No recent exposure to known allergens. ? ? ? ?Past Medical History: ?No date: Atypical migraine ?No date: [facility]) ?    Comment:  Skin ?No date: Hypothyroidism ?No date: PVC's (premature ventricular contractions) ?No date: Vertigo ? ?Past Surgical History: ?08/13/2017: CHOLECYSTECTOMY; N/A ?    Comment:  Procedure: LAPAROSCOPIC CHOLECYSTECTOMY;  Surgeon:  ?             Virl Cagey, MD;  Location: [facility] ORS;  Service:  ?             General;  Laterality: N/A; ?No date: SALPINGOOPHORECTOMY ?    Comment:  LEFT ?No date: VAGINAL HYSTERECTOMY  ? ? ?The history is provided by the patient and the spouse. No language interpreter was used.  ?Allergic Reaction ?Presenting symptoms: rash   ?Presenting symptoms: no difficulty swallowing   ? ?  ? ?Home Medications ?Prior to Admission medications   ?Medication Sig Start Date End Date Taking? Authorizing Provider  ?Carboxymethylcellul-Glycerin (CLEAR EYES FOR DRY EYES) 1-0.25 % SOLN Apply 1 drop to eye daily as needed.    [provider]  ?Cholecalciferol (VITAMIN D-3) 5000 units TABS Take 1 capsule by mouth daily.     [provider]  ?diclofenac sodium (VOLTAREN) 1 % GEL Apply topically 4 (four) times daily.    [provider]  ?doxycycline (VIBRA-TABS) 100 MG tablet Take 1 tablet (100 mg total) by mouth 2 (two) times daily. 02/19/20   Edrick Kins, DPM  ?estradiol (VIVELLE-DOT) 0.1 MG/24HR patch Place 1 patch onto the skin 2 (two) times a week.    [provider]  ?fluconazole (DIFLUCAN) 150 MG tablet TAKE 1 TABLET BY MOUTH FOR ONE DOSE 12/13/17   [provider]  ?gentamicin cream (GARAMYCIN) 0.1 % Apply 1 application topically 2 (two) times daily. 02/07/20   Edrick Kins, DPM  ?hydrocortisone (ANUSOL-HC) 2.5 % rectal cream Apply topically. 01/19/20   [provider]  ?levothyroxine (SYNTHROID, LEVOTHROID) 50 MCG tablet Take 50 mcg by mouth daily.    [provider]  ?loratadine (CLARITIN) 10 MG tablet Take 10 mg by mouth daily.    [provider]  ?meloxicam (MOBIC) 15 MG tablet Take 1 tablet (15 mg total) by mouth daily. 04/04/20   Wallene Huh, DPM  ?Multiple Vitamin (MULTIVITAMIN) tablet Take 1 tablet by mouth daily.    [provider]  ?Centre  ?Achilles Tendonitis Cream- ?Diclofenac 3%, Baclofen 2%, Bupivacaine 1%, Doxepin 5%, Gabapentin 6%, Ibuprofen 3%, Pentoxifylline 3% ?Apply 1-2 grams to affected  area 3-4 times daily ?Qty. 120 gm ?3 refills    [provider]  ?pantoprazole (PROTONIX) 40 MG tablet Take 40 mg by mouth daily.     [provider]  ?traMADol (ULTRAM) 50 MG tablet Take 50 mg by mouth 4 (four) times daily as needed. 04/12/20   [provider]  ?zolpidem (AMBIEN) 10 MG tablet Take 1 tablet by mouth at bedtime. 06/08/17   [provider]  ?   ? ?Allergies    ?Penicillins, Sulfa antibiotics, Amoxicillin, and Diclofenac   ? ?Review of Systems   ?Review of Systems  ?Constitutional:  Negative for  chills and fever.  ?HENT:  Negative for facial swelling and trouble swallowing.   ?Eyes:  Negative for photophobia and visual disturbance.  ?Respiratory:  Negative for cough and shortness of breath.   ?Cardiovascular:  Negative for chest pain and palpitations.  ?Gastrointestinal:  Negative for abdominal pain, nausea and vomiting.  ?Endocrine: Negative for polydipsia and polyuria.  ?Genitourinary:  Negative for difficulty urinating and hematuria.  ?Musculoskeletal:  Negative for gait problem and joint swelling.  ?Skin:  Positive for rash. Negative for pallor.  ?Neurological:  Negative for syncope and headaches.  ?Psychiatric/Behavioral:  Negative for agitation and confusion.   ? ?Physical Exam ?Updated Vital Signs ?BP (!) 143/71 (BP Location: Right Arm)   Pulse 70   Temp 97.9 ?F (36.6 ?C) (Oral)   Resp 18   SpO2 100%  ?Physical Exam ?Vitals and nursing note reviewed.  ?Constitutional:   ?   General: She is not in acute distress. ?   Appearance: Normal appearance.  ?HENT:  ?   Head: Normocephalic and atraumatic. No raccoon eyes, Battle's sign, right periorbital erythema or left periorbital erythema.  ?   Jaw: No trismus.  ?   Right Ear: External ear normal.  ?   Left Ear: External ear normal.  ?   Nose: Nose normal.  ?   Mouth/Throat:  ?   Mouth: Mucous membranes are moist.  ?   Pharynx: Oropharynx is clear. Uvula midline. No posterior oropharyngeal erythema or uvula swelling.  ?   Comments: No angioedema, no tongue or uvula swelling ?Eyes:  ?   General: No scleral icterus.    ?   Right eye: No discharge.     ?   Left eye: No discharge.  ?   Extraocular Movements: Extraocular movements intact.  ?   Pupils: Pupils are equal, round, and reactive to light.  ?Cardiovascular:  ?   Rate and Rhythm: Normal rate and regular rhythm.  ?   Pulses: Normal pulses.  ?   Heart sounds: Normal heart sounds.  ?Pulmonary:  ?   Effort: Pulmonary effort is normal. No respiratory distress.  ?   Breath sounds: Normal breath sounds.   ?Abdominal:  ?   General: Abdomen is flat.  ?   Tenderness: There is no abdominal tenderness.  ?Musculoskeletal:     ?   General: Normal range of motion.  ?   Cervical back: Normal range of motion.  ?   Right lower leg: No edema.  ?   Left lower leg: No edema.  ?Skin: ?   General: Skin is warm and dry.  ?   Capillary Refill: Capillary refill takes less than 2 seconds.  ?   Comments: No rashes  ?Neurological:  ?   Mental Status: She is alert and oriented to person, place, and time.  ?   GCS: GCS eye subscore is 4. GCS  verbal subscore is 5. GCS motor subscore is 6.  ?Psychiatric:     ?   Mood and Affect: Mood normal.     ?   Behavior: Behavior normal.  ? ? ?ED Results / Procedures / Treatments   ?Labs ?(all labs ordered are listed, but only abnormal results are displayed) ?Labs Reviewed - No data to display ? ?EKG ?None ? ?Radiology ?No results found. ? ?Procedures ?Procedures  ? ? ?Medications Ordered in ED ?Medications  ?famotidine (PEPCID) tablet 20 mg (20 mg Oral Given 07/04/21 1512)  ? ? ?ED Course/ Medical Decision Making/ A&P ?  ?                        ?Medical Decision Making ? ? ?CC: Rash, possible allergic reaction ? ?This patient presents to the Emergency Department for the above complaint. This involves an extensive number of treatment options and is a complaint that carries with it a high risk of complications and morbidity. Vital signs were reviewed. Serious etiologies considered. ? ?Differential includes but is not limited to, flushing from niacin, allergic reaction, other acute etiologies.  I have very low suspicion for true allergy, anaphylaxis, no angioedema. ? ?Record review:  ?Previous records obtained and reviewed prior consult notes, prior office visits.  Prior labs and imaging ? ?Additional history obtained from spouse ? ?Medical and surgical history as noted above.  ? ? ?Management: ?Patient was given Pepcid ? ?Reassessment:  ?Patient reports she is currently back to her baseline.  No further  flushing, no rashes noted, no need for epinephrine.  ? ?I favor patient's presentation today is likely secondary to medication effect from niacin.  Low suspicion for true allergic reaction.  ? ?Would be reaso

## 2021-07-04 NOTE — ED Triage Notes (Addendum)
Pt brought in by EMS due to allergic reaction. Pt reports she took niacin ER approx 1015 and went to work at 21. Reports approx 1255 she became hot and red which started form her head to her toes. Arms remain red, swollen and warm. Denies SOB or throat swelling. Pt given en route 25 mg benadryl and Benadryl 25 mg IV. Solumedrol125 IV ?

## 2021-07-04 NOTE — ED Notes (Signed)
Tolerating PO liquids at this time. ?

## 2022-12-17 ENCOUNTER — Ambulatory Visit: Payer: No Typology Code available for payment source

## 2022-12-17 ENCOUNTER — Encounter: Payer: Self-pay | Admitting: Podiatry

## 2022-12-17 ENCOUNTER — Ambulatory Visit: Payer: No Typology Code available for payment source | Admitting: Podiatry

## 2022-12-17 DIAGNOSIS — M79671 Pain in right foot: Secondary | ICD-10-CM

## 2022-12-17 DIAGNOSIS — M7661 Achilles tendinitis, right leg: Secondary | ICD-10-CM

## 2022-12-17 MED ORDER — TRIAMCINOLONE ACETONIDE 10 MG/ML IJ SUSP
10.0000 mg | Freq: Once | INTRAMUSCULAR | Status: AC
  Administered 2022-12-17: 10 mg via INTRA_ARTICULAR

## 2022-12-18 NOTE — Progress Notes (Signed)
Subjective:   Patient ID: Kristine Melton, female   DOB: 57 y.o.   MRN: [id number]   HPI Patient presents with pain in the back of the right ankle on the medial side with a small fluid-filled inflammation that is been painful and hurts with shoe gear.  Not been seen for over 2 years   ROS      Objective:  Physical Exam  Neurovascular status intact inflammation of the Achilles insertion medial side no central or lateral band involvement     Assessment:  Inflammatory tendinitis right posterior Achilles medial side     Plan:  H&P reviewed I have recommended very careful injection I explained procedure and risk and chances for rupture patient wants surgery and I went ahead today infiltrated the area after sterile prep 3 mg Dexasone Kenalog 5 mg Xylocaine applied sterile dressing advised on reduced activity reappoint as symptoms indicate  X-rays were negative for signs of bone spur or fracture appears to be soft tissue inflammatory
